# Patient Record
Sex: Female | Born: 1990 | Race: Black or African American | Hispanic: No | Marital: Single | Smoking: Former smoker
Health system: Southern US, Community
[De-identification: ages and names within clinical notes are randomized; demographics above are authoritative.]

## PROBLEM LIST (undated history)

## (undated) DIAGNOSIS — I10 Essential (primary) hypertension: Secondary | ICD-10-CM

## (undated) HISTORY — PX: CERVICAL CONE BIOPSY: SUR198

## (undated) HISTORY — PX: LEEP: SHX91

---

## 2013-10-28 ENCOUNTER — Emergency Department (HOSPITAL_BASED_OUTPATIENT_CLINIC_OR_DEPARTMENT_OTHER)
Admission: EM | Admit: 2013-10-28 | Discharge: 2013-10-28 | Disposition: A | Discharge status: Home / Self Care | Payer: Managed Care, Other (non HMO) | Attending: Emergency Medicine | Admitting: Emergency Medicine

## 2013-10-28 ENCOUNTER — Encounter (HOSPITAL_BASED_OUTPATIENT_CLINIC_OR_DEPARTMENT_OTHER): Payer: Self-pay | Admitting: Emergency Medicine

## 2013-10-28 ENCOUNTER — Emergency Department (HOSPITAL_BASED_OUTPATIENT_CLINIC_OR_DEPARTMENT_OTHER): Payer: Managed Care, Other (non HMO)

## 2013-10-28 DIAGNOSIS — M722 Plantar fascial fibromatosis: Secondary | ICD-10-CM | POA: Insufficient documentation

## 2013-10-28 MED ORDER — IBUPROFEN 800 MG PO TABS: 800.0000 mg | ORAL_TABLET | Freq: Three times a day (TID) | ORAL | Status: DC

## 2013-10-28 NOTE — Discharge Instructions (Signed)

## 2013-10-28 NOTE — ED Provider Notes (Signed)
Medical screening examination/treatment/procedure(s) were performed by non-physician practitioner and as supervising physician I was immediately available for consultation/collaboration.  EKG Interpretation   None         Dagmar HaitWilliam Shallen Luedke, MD 10/28/13 Serena [id number]

## 2013-10-28 NOTE — ED Notes (Signed)
Patient transported to X-ray ambulatory with tech. 

## 2013-10-28 NOTE — ED Provider Notes (Signed)
CSN: [id number]     Arrival date & time 10/28/13  1703 History   First MD Initiated Contact with Patient 10/28/13 1727     Chief Complaint  Patient presents with  . Foot Pain   (Consider location/radiation/quality/duration/timing/severity/associated sxs/prior Treatment) Patient is a 23 y.o. female presenting with foot injury. No language interpreter was used.  Foot Injury Location:  Foot Foot location:  L foot Pain details:    Quality:  Aching   Severity:  Moderate   Onset quality:  Gradual   Duration:  2 days   Timing:  Constant   Progression:  Worsening Foreign body present:  No foreign bodies Prior injury to area:  No Worsened by:  Nothing tried Associated symptoms: no back pain     History reviewed. No pertinent past medical history. History reviewed. No pertinent past surgical history. History reviewed. No pertinent family history. History  Substance Use Topics  . Smoking status: Never Smoker   . Smokeless tobacco: Not on file  . Alcohol Use: No   OB History   Grav Para Term Preterm Abortions TAB SAB Ect Mult Living                 Review of Systems  Musculoskeletal: Negative for back pain.  All other systems reviewed and are negative.    Allergies  Review of patient's allergies indicates no known allergies.  Home Medications  No current outpatient prescriptions on file. BP 130/78  Pulse 84  Temp(Src) 98.1 F (36.7 C) (Oral)  Resp 18  Ht 6' (1.829 m)  Wt 236 lb (107.049 kg)  BMI 32.00 kg/m2  SpO2 100%  LMP 08/23/2013 Physical Exam  Vitals reviewed. Constitutional: She is oriented to person, place, and time. She appears well-developed and well-nourished.  HENT:  Head: Normocephalic.  Musculoskeletal: She exhibits tenderness.  Swollen left foot,  From  nv and ns intact    Neurological: She is alert and oriented to person, place, and time. She has normal reflexes.  Skin: Skin is warm.  Psychiatric: She has a normal mood and affect.    ED  Course  Procedures (including critical care time) Labs Review Labs Reviewed - No data to display Imaging Review No results found.  EKG Interpretation   None       MDM   1. Plantar fasciitis     Probable plantar fascitis.   Pt advised ibuprofen,   Exercises,   Buy shoe inserts,   Follow up with Dr. Pearletha ForgeHudnall for evaluation    Elson AreasLeslie K Sofia, PA-C 10/28/13 1909

## 2013-10-28 NOTE — ED Notes (Signed)
Pt c/o left foot pain with swelling x 1 month

## 2013-12-29 ENCOUNTER — Emergency Department (HOSPITAL_BASED_OUTPATIENT_CLINIC_OR_DEPARTMENT_OTHER)
Admission: EM | Admit: 2013-12-29 | Discharge: 2013-12-29 | Disposition: A | Discharge status: Home / Self Care | Payer: Managed Care, Other (non HMO) | Attending: Emergency Medicine | Admitting: Emergency Medicine

## 2013-12-29 ENCOUNTER — Encounter (HOSPITAL_BASED_OUTPATIENT_CLINIC_OR_DEPARTMENT_OTHER): Payer: Self-pay | Admitting: Emergency Medicine

## 2013-12-29 DIAGNOSIS — R51 Headache: Secondary | ICD-10-CM | POA: Insufficient documentation

## 2013-12-29 DIAGNOSIS — Z3202 Encounter for pregnancy test, result negative: Secondary | ICD-10-CM | POA: Insufficient documentation

## 2013-12-29 DIAGNOSIS — Z791 Long term (current) use of non-steroidal anti-inflammatories (NSAID): Secondary | ICD-10-CM | POA: Insufficient documentation

## 2013-12-29 DIAGNOSIS — F17200 Nicotine dependence, unspecified, uncomplicated: Secondary | ICD-10-CM | POA: Insufficient documentation

## 2013-12-29 DIAGNOSIS — R42 Dizziness and giddiness: Secondary | ICD-10-CM | POA: Insufficient documentation

## 2013-12-29 LAB — CBC WITH DIFFERENTIAL/PLATELET
Basophils Absolute: 0.1 10*3/uL (ref 0.0–0.1)
Basophils Relative: 1 % (ref 0–1)
EOS ABS: 0.2 10*3/uL (ref 0.0–0.7)
Eosinophils Relative: 2 % (ref 0–5)
HEMATOCRIT: 42.2 % (ref 36.0–46.0)
HEMOGLOBIN: 14 g/dL (ref 12.0–15.0)
LYMPHS ABS: 3.9 10*3/uL (ref 0.7–4.0)
Lymphocytes Relative: 42 % (ref 12–46)
MCH: 30.2 pg (ref 26.0–34.0)
MCHC: 33.2 g/dL (ref 30.0–36.0)
MCV: 91.1 fL (ref 78.0–100.0)
MONO ABS: 1.1 10*3/uL — AB (ref 0.1–1.0)
MONOS PCT: 12 % (ref 3–12)
Neutro Abs: 4.2 10*3/uL (ref 1.7–7.7)
Neutrophils Relative %: 44 % (ref 43–77)
Platelets: 328 10*3/uL (ref 150–400)
RBC: 4.63 MIL/uL (ref 3.87–5.11)
RDW: 13 % (ref 11.5–15.5)
WBC: 9.5 10*3/uL (ref 4.0–10.5)

## 2013-12-29 LAB — URINALYSIS, ROUTINE W REFLEX MICROSCOPIC
BILIRUBIN URINE: NEGATIVE
GLUCOSE, UA: NEGATIVE mg/dL
Hgb urine dipstick: NEGATIVE
KETONES UR: NEGATIVE mg/dL
Nitrite: NEGATIVE
PROTEIN: NEGATIVE mg/dL
Specific Gravity, Urine: 1.023 (ref 1.005–1.030)
Urobilinogen, UA: 0.2 mg/dL (ref 0.0–1.0)
pH: 5.5 (ref 5.0–8.0)

## 2013-12-29 LAB — URINE MICROSCOPIC-ADD ON

## 2013-12-29 LAB — BASIC METABOLIC PANEL
BUN: 14 mg/dL (ref 6–23)
CO2: 27 mEq/L (ref 19–32)
Calcium: 9.4 mg/dL (ref 8.4–10.5)
Chloride: 100 mEq/L (ref 96–112)
Creatinine, Ser: 0.9 mg/dL (ref 0.50–1.10)
GFR calc Af Amer: >90 mL/min (ref >90)
GFR calc non Af Amer: 89 mL/min — ABNORMAL LOW (ref >90)
GLUCOSE: 99 mg/dL (ref 70–99)
Potassium: 4 mEq/L (ref 3.7–5.3)
Sodium: 140 mEq/L (ref 137–147)

## 2013-12-29 LAB — PREGNANCY, URINE: Preg Test, Ur: NEGATIVE

## 2013-12-29 NOTE — ED Notes (Signed)
Pt states she was at work and had a sudden onset of feeling dizzy and hot.

## 2013-12-29 NOTE — ED Notes (Signed)
Pt reports was at work when suddenly dizzy and had ha.

## 2013-12-29 NOTE — ED Notes (Signed)
Patient ambulates around RN station without assistance or difficulty.

## 2013-12-29 NOTE — ED Provider Notes (Signed)
Medical screening examination/treatment/procedure(s) were performed by non-physician practitioner and as supervising physician I was immediately available for consultation/collaboration.   EKG Interpretation None        Jessica B. Bernette MayersSheldon, MD 12/29/13 [id number]

## 2013-12-29 NOTE — Discharge Instructions (Signed)

## 2013-12-29 NOTE — ED Provider Notes (Signed)
CSN: [id number]     Arrival date & time 12/29/13  1705 History   First MD Initiated Contact with Patient 12/29/13 1711     Chief Complaint  Patient presents with  . Dizziness     (Consider location/radiation/quality/duration/timing/severity/associated sxs/prior Treatment) HPI Comments: Pt state that she got really hot and then felt near syncopal and now has a headache. Pt states that besides feeling warm now the symptoms are getting better. Pt states that she took ibuprofen a shot time ago. lmp was November. No visual changes, cp, sob, n/v/d. Pt last ate about 3 hours ago  Patient is a 23 y.o. female presenting with dizziness. The history is provided by the patient. No language interpreter was used.  Dizziness Quality:  Lightheadedness Severity:  Moderate Onset quality:  Sudden Timing:  Constant Progression:  Unchanged Chronicity:  New Relieved by:  Nothing Associated symptoms: no blood in stool, no chest pain, no nausea, no palpitations and no vision changes     History reviewed. No pertinent past medical history. History reviewed. No pertinent past surgical history. No family history on file. History  Substance Use Topics  . Smoking status: Current Every Day Smoker    Types: Cigars  . Smokeless tobacco: Not on file  . Alcohol Use: No   OB History   Grav Para Term Preterm Abortions TAB SAB Ect Mult Living                 Review of Systems  Constitutional: Negative.   Cardiovascular: Negative for chest pain and palpitations.  Gastrointestinal: Negative for nausea and blood in stool.  Neurological: Positive for dizziness.      Allergies  Review of patient's allergies indicates no known allergies.  Home Medications   Current Outpatient Rx  Name  Route  Sig  Dispense  Refill  . ibuprofen (ADVIL,MOTRIN) 800 MG tablet   Oral   Take 1 tablet (800 mg total) by mouth 3 (three) times daily.   21 tablet   0    BP 128/87  Pulse 93  Temp(Src) 98.5 F (36.9 C)  (Oral)  Resp 18  Ht 6' (1.829 m)  Wt 235 lb (106.595 kg)  BMI 31.86 kg/m2  SpO2 100% Physical Exam  Nursing note and vitals reviewed. Constitutional: She is oriented to person, place, and time. She appears well-developed and well-nourished.  HENT:  Head: Normocephalic and atraumatic.  Eyes: EOM are normal.  Cardiovascular: Normal rate and regular rhythm.   Pulmonary/Chest: Effort normal and breath sounds normal.  Abdominal: Soft. Bowel sounds are normal. There is no tenderness.  Musculoskeletal: Normal range of motion.  Neurological: She is alert and oriented to person, place, and time. Coordination normal.  Skin: Skin is warm and dry.  Psychiatric: She has a normal mood and affect.    ED Course  Procedures (including critical care time) Labs Review Labs Reviewed  URINALYSIS, ROUTINE W REFLEX MICROSCOPIC - Abnormal; Notable for the following:    APPearance CLOUDY (*)    Leukocytes, UA SMALL (*)    All other components within normal limits  CBC WITH DIFFERENTIAL - Abnormal; Notable for the following:    Monocytes Absolute 1.1 (*)    All other components within normal limits  BASIC METABOLIC PANEL - Abnormal; Notable for the following:    GFR calc non Af Amer 89 (*)    All other components within normal limits  URINE MICROSCOPIC-ADD ON - Abnormal; Notable for the following:    Squamous Epithelial / LPF FEW (*)  Bacteria, UA MANY (*)    All other components within normal limits  URINE CULTURE  PREGNANCY, URINE   Imaging Review No results found.   EKG Interpretation None      MDM   Final diagnoses:  Dizziness    Pt ambulating without any problem and is feeling better. Pt okay to go home. No acute findings noted    Teressa Lower, NP 12/29/13 1827

## 2014-01-01 LAB — URINE CULTURE

## 2014-12-19 ENCOUNTER — Emergency Department (HOSPITAL_BASED_OUTPATIENT_CLINIC_OR_DEPARTMENT_OTHER): Payer: Managed Care, Other (non HMO)

## 2014-12-19 ENCOUNTER — Encounter (HOSPITAL_BASED_OUTPATIENT_CLINIC_OR_DEPARTMENT_OTHER): Payer: Self-pay | Admitting: *Deleted

## 2014-12-19 ENCOUNTER — Emergency Department (HOSPITAL_BASED_OUTPATIENT_CLINIC_OR_DEPARTMENT_OTHER)
Admission: EM | Admit: 2014-12-19 | Discharge: 2014-12-19 | Disposition: A | Discharge status: Home / Self Care | Payer: Managed Care, Other (non HMO) | Attending: Emergency Medicine | Admitting: Emergency Medicine

## 2014-12-19 DIAGNOSIS — R51 Headache: Secondary | ICD-10-CM | POA: Diagnosis present

## 2014-12-19 DIAGNOSIS — R6884 Jaw pain: Secondary | ICD-10-CM | POA: Diagnosis not present

## 2014-12-19 DIAGNOSIS — Z720 Tobacco use: Secondary | ICD-10-CM | POA: Insufficient documentation

## 2014-12-19 DIAGNOSIS — Z791 Long term (current) use of non-steroidal anti-inflammatories (NSAID): Secondary | ICD-10-CM | POA: Diagnosis not present

## 2014-12-19 MED ORDER — HYDROCODONE-ACETAMINOPHEN 5-325 MG PO TABS
1.0000 | ORAL_TABLET | Freq: Once | ORAL | Status: AC
  Administered 2014-12-19: 1 via ORAL
  Filled 2014-12-19: qty 1

## 2014-12-19 MED ORDER — TRAMADOL HCL 50 MG PO TABS: 50.0000 mg | ORAL_TABLET | Freq: Four times a day (QID) | ORAL | Status: DC | PRN

## 2014-12-19 MED ORDER — NAPROXEN 500 MG PO TABS: 500.0000 mg | ORAL_TABLET | Freq: Two times a day (BID) | ORAL | Status: DC

## 2014-12-19 NOTE — Discharge Instructions (Signed)
Takes naproxen and tramadol for pain. Try warm compresses. Avoid any hard foods. Follow-up with her doctor or return here if the symptoms are worsening.  Temporomandibular Problems  Temporomandibular joint (TMJ) dysfunction means there are problems with the joint between your jaw and your skull. This is a joint lined by cartilage like other joints in your body but also has a small disc in the joint which keeps the bones from rubbing on each other. These joints are like other joints and can get inflamed (sore) from arthritis and other problems. When this joint gets sore, it can cause headaches and pain in the jaw and the face. CAUSES  Usually the arthritic types of problems are caused by soreness in the joint. Soreness in the joint can also be caused by overuse. This may come from grinding your teeth. It may also come from mis-alignment in the joint. DIAGNOSIS Diagnosis of this condition can often be made by history and exam. Sometimes your caregiver may need X-rays or an MRI scan to determine the exact cause. It may be necessary to see your dentist to determine if your teeth and jaws are lined up correctly. TREATMENT  Most of the time this problem is not serious; however, sometimes it can persist (become chronic). When this happens medications that will cut down on inflammation (soreness) help. Sometimes a shot of cortisone into the joint will be helpful. If your teeth are not aligned it may help for your dentist to make a splint for your mouth that can help this problem. If no physical problems can be found, the problem may come from tension. If tension is found to be the cause, biofeedback or relaxation techniques may be helpful. HOME CARE INSTRUCTIONS   Later in the day, applications of ice packs may be helpful. Ice can be used in a plastic bag with a towel around it to prevent frostbite to skin. This may be used about every 2 hours for 20 to 30 minutes, as needed while awake, or as directed by your  caregiver.  Only take over-the-counter or prescription medicines for pain, discomfort, or fever as directed by your caregiver.  If physical therapy was prescribed, follow your caregiver's directions.  Wear mouth appliances as directed if they were given. Document Released: 06/19/2001 Document Revised: 12/17/2011 Document Reviewed: 09/26/2008 [facility] Patient Information 2015 [employer] [place], [place]. This information is not intended to replace advice given to you by your health care provider. Make sure you discuss any questions you have with your health care provider.

## 2014-12-19 NOTE — ED Provider Notes (Signed)
CSN: [id number]     Arrival date & time 12/19/14  1446 History   First MD Initiated Contact with Patient 12/19/14 1550     Chief Complaint  Patient presents with  . Facial Pain     (Consider location/radiation/quality/duration/timing/severity/associated sxs/prior Treatment) HPI Jessica Murray is a 24 y.o. female with no medical problems, presents to emergency department complaining of left-sided jaw pain. Patient states that pain started when she woke up this morning. Patient states that she is having pain opening and closing her mouth. She states she is unable to eat or by downtrending. She denies a toothache. She denies any dental injuries. She denies any jaw injuries. She denies any fever or chills. No pain with swallowing. States pain is around her left ear. No drainage from the ear. No upper respiratory symptoms. No history of the same.  History reviewed. No pertinent past medical history. History reviewed. No pertinent past surgical history. No family history on file. History  Substance Use Topics  . Smoking status: Current Every Day Smoker    Types: Cigars  . Smokeless tobacco: Never Used  . Alcohol Use: Yes     Comment: weekends   OB History    No data available     Review of Systems  HENT: Positive for facial swelling. Negative for congestion, dental problem and sore throat.   Neurological: Positive for headaches.  All other systems reviewed and are negative.     Allergies  Review of patient's allergies indicates no known allergies.  Home Medications   Prior to Admission medications   Medication Sig Start Date End Date Taking? Authorizing Provider  ibuprofen (ADVIL,MOTRIN) 800 MG tablet Take 1 tablet (800 mg total) by mouth 3 (three) times daily. 10/28/13   Lonia Skinner Sofia, PA-C   BP 130/90 mmHg  Pulse 106  Temp(Src) 98.9 F (37.2 C) (Oral)  Resp 18  Ht 6' (1.829 m)  Wt 256 lb (116.121 kg)  BMI 34.71 kg/m2  SpO2 100% Physical Exam  Constitutional: She  appears well-developed and well-nourished. No distress.  HENT:  No obvious facial swelling noted.  No swelling under the tongue. No trismus. Tender to palpation over her left TMJ, tender to palpation around the left mandible and just inferior to left ear. No tenderness over her mastoid. TMs are normal bilaterally. Dentition unremarkable  Eyes: Conjunctivae are normal.  Neck: Neck supple.  Neurological: She is alert.  Skin: Skin is warm and dry.  Nursing note and vitals reviewed.   ED Course  Procedures (including critical care time) Labs Review Labs Reviewed - No data to display  Imaging Review Dg Mandible 4 Views  12/19/2014   CLINICAL DATA:  Left-sided jaw pain for 1 day  EXAM: MANDIBLE - 4+ VIEW  COMPARISON:  None.  FINDINGS: There is no evidence of fracture or other focal bone lesions.  IMPRESSION: Negative.   Electronically Signed   By: Elige Ko   On: 12/19/2014 17:05     EKG Interpretation None      MDM   Final diagnoses:  Jaw pain    patient with left facial/TM J pain, no signs of infection on exam. Dentition is normal. Mandibular films obtained and are negative. She is afebrile, nontoxic appearing. Patient admits to drinking last night of partying, denies any injuries however. 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 I did instruct her to return if she develops any swelling to the area, fever, chills, new concerning symptoms.  Filed Vitals:  12/19/14 1454  BP: 130/90  Pulse: 106  Temp: 98.9 F (37.2 C)  TempSrc: Oral  Resp: 18  Height: 6' (1.829 m)  Weight: 256 lb (116.121 kg)  SpO2: 100%       Jaynie Crumbleatyana Judas Mohammad, PA-C 12/19/14 1722  Jerelyn ScottMartha Linker, MD 12/19/14 1727

## 2014-12-19 NOTE — ED Notes (Signed)
Left side jaw pain since waking- denies injury

## 2015-01-01 ENCOUNTER — Encounter (HOSPITAL_BASED_OUTPATIENT_CLINIC_OR_DEPARTMENT_OTHER): Payer: Self-pay

## 2015-01-01 ENCOUNTER — Emergency Department (HOSPITAL_BASED_OUTPATIENT_CLINIC_OR_DEPARTMENT_OTHER)
Admission: EM | Admit: 2015-01-01 | Discharge: 2015-01-01 | Disposition: A | Discharge status: Home / Self Care | Payer: Managed Care, Other (non HMO) | Attending: Emergency Medicine | Admitting: Emergency Medicine

## 2015-01-01 ENCOUNTER — Emergency Department (HOSPITAL_BASED_OUTPATIENT_CLINIC_OR_DEPARTMENT_OTHER): Payer: Managed Care, Other (non HMO)

## 2015-01-01 DIAGNOSIS — R69 Illness, unspecified: Secondary | ICD-10-CM

## 2015-01-01 DIAGNOSIS — Z720 Tobacco use: Secondary | ICD-10-CM | POA: Insufficient documentation

## 2015-01-01 DIAGNOSIS — Z3202 Encounter for pregnancy test, result negative: Secondary | ICD-10-CM | POA: Insufficient documentation

## 2015-01-01 DIAGNOSIS — J111 Influenza due to unidentified influenza virus with other respiratory manifestations: Secondary | ICD-10-CM | POA: Insufficient documentation

## 2015-01-01 DIAGNOSIS — R000 Tachycardia, unspecified: Secondary | ICD-10-CM | POA: Diagnosis not present

## 2015-01-01 DIAGNOSIS — Z791 Long term (current) use of non-steroidal anti-inflammatories (NSAID): Secondary | ICD-10-CM | POA: Insufficient documentation

## 2015-01-01 DIAGNOSIS — R630 Anorexia: Secondary | ICD-10-CM | POA: Insufficient documentation

## 2015-01-01 DIAGNOSIS — R05 Cough: Secondary | ICD-10-CM | POA: Diagnosis present

## 2015-01-01 LAB — CBC WITH DIFFERENTIAL/PLATELET
Basophils Absolute: 0 10*3/uL (ref 0.0–0.1)
Basophils Relative: 0 % (ref 0–1)
Eosinophils Absolute: 0 10*3/uL (ref 0.0–0.7)
Eosinophils Relative: 0 % (ref 0–5)
HCT: 44.1 % (ref 36.0–46.0)
Hemoglobin: 14.6 g/dL (ref 12.0–15.0)
Lymphocytes Relative: 28 % (ref 12–46)
Lymphs Abs: 1.8 10*3/uL (ref 0.7–4.0)
MCH: 29.5 pg (ref 26.0–34.0)
MCHC: 33.1 g/dL (ref 30.0–36.0)
MCV: 89.1 fL (ref 78.0–100.0)
MONO ABS: 1.5 10*3/uL — AB (ref 0.1–1.0)
Monocytes Relative: 23 % — ABNORMAL HIGH (ref 3–12)
Neutro Abs: 3.2 10*3/uL (ref 1.7–7.7)
Neutrophils Relative %: 49 % (ref 43–77)
Platelets: 329 10*3/uL (ref 150–400)
RBC: 4.95 MIL/uL (ref 3.87–5.11)
RDW: 13.3 % (ref 11.5–15.5)
WBC: 6.6 10*3/uL (ref 4.0–10.5)

## 2015-01-01 LAB — COMPREHENSIVE METABOLIC PANEL
ALBUMIN: 3.9 g/dL (ref 3.5–5.2)
ALK PHOS: 99 U/L (ref 39–117)
ALT: 23 U/L (ref 0–35)
ANION GAP: 9 (ref 5–15)
AST: 19 U/L (ref 0–37)
BUN: 9 mg/dL (ref 6–23)
CHLORIDE: 104 mmol/L (ref 96–112)
CO2: 23 mmol/L (ref 19–32)
Calcium: 9 mg/dL (ref 8.4–10.5)
Creatinine, Ser: 0.88 mg/dL (ref 0.50–1.10)
GFR calc Af Amer: >90 mL/min (ref >90)
GFR calc non Af Amer: >90 mL/min (ref >90)
GLUCOSE: 121 mg/dL — AB (ref 70–99)
Potassium: 3.5 mmol/L (ref 3.5–5.1)
Sodium: 136 mmol/L (ref 135–145)
TOTAL PROTEIN: 7.9 g/dL (ref 6.0–8.3)

## 2015-01-01 LAB — PREGNANCY, URINE: Preg Test, Ur: NEGATIVE

## 2015-01-01 LAB — URINE MICROSCOPIC-ADD ON

## 2015-01-01 LAB — URINALYSIS, ROUTINE W REFLEX MICROSCOPIC
BILIRUBIN URINE: NEGATIVE
GLUCOSE, UA: NEGATIVE mg/dL
KETONES UR: NEGATIVE mg/dL
Leukocytes, UA: NEGATIVE
Nitrite: NEGATIVE
Protein, ur: NEGATIVE mg/dL
Specific Gravity, Urine: 1.017 (ref 1.005–1.030)
UROBILINOGEN UA: 1 mg/dL (ref 0.0–1.0)
pH: 5.5 (ref 5.0–8.0)

## 2015-01-01 MED ORDER — SODIUM CHLORIDE 0.9 % IV BOLUS (SEPSIS)
1000.0000 mL | Freq: Once | INTRAVENOUS | Status: AC
  Administered 2015-01-01: 1000 mL via INTRAVENOUS

## 2015-01-01 MED ORDER — ONDANSETRON HCL 4 MG/2ML IJ SOLN
4.0000 mg | Freq: Once | INTRAMUSCULAR | Status: AC
  Administered 2015-01-01: 4 mg via INTRAVENOUS
  Filled 2015-01-01: qty 2

## 2015-01-01 MED ORDER — KETOROLAC TROMETHAMINE 30 MG/ML IJ SOLN
30.0000 mg | Freq: Once | INTRAMUSCULAR | Status: AC
  Administered 2015-01-01: 30 mg via INTRAVENOUS
  Filled 2015-01-01: qty 1

## 2015-01-01 MED ORDER — ACETAMINOPHEN 325 MG PO TABS
650.0000 mg | ORAL_TABLET | Freq: Once | ORAL | Status: AC
  Administered 2015-01-01: 650 mg via ORAL
  Filled 2015-01-01: qty 2

## 2015-01-01 NOTE — Discharge Instructions (Signed)

## 2015-01-01 NOTE — ED Notes (Signed)
Pt reports 2 days of body aches, subjective fever, n/v and cough/congestion.  Denies sore throat.  No known sick contacts.

## 2015-01-01 NOTE — ED Provider Notes (Signed)
CSN: [id number]     Arrival date & time 01/01/15  1527 History  This chart was scribed for Tilden Fossa, MD by [place] Peace, ED Scribe. The patient was seen in [facility]. The patient's care was started at 3:56 PM.    Chief Complaint  Patient presents with  . Influenza      Patient is a 24 y.o. female presenting with flu symptoms. The history is provided by the patient. No language interpreter was used.  Influenza Presenting symptoms: cough, headache, myalgias, nausea and vomiting   Presenting symptoms: no fever and no sore throat   Associated symptoms: chills and nasal congestion   HPI Comments: Jessica Murray is a 24 y.o. female who presents to the Emergency Department complaining of flu-like symptoms onset a couple days ago including chills, body aches, headache, loss of appetite, nausea, and vomiting. She also complains of cough and congestion. No complaints of sore throat, fever, dysuria, or rash. She denies any exposure to sick contacts. Pt is a current everyday smoker.  She vomited once yesterday.   History reviewed. No pertinent past medical history. History reviewed. No pertinent past surgical history. No family history on file. History  Substance Use Topics  . Smoking status: Current Every Day Smoker    Types: Cigars  . Smokeless tobacco: Never Used  . Alcohol Use: No   OB History    No data available     Review of Systems  Constitutional: Positive for chills and appetite change. Negative for fever.  HENT: Positive for congestion. Negative for sore throat.   Respiratory: Positive for cough.   Gastrointestinal: Positive for nausea and vomiting.  Genitourinary: Negative for dysuria.  Musculoskeletal: Positive for myalgias.  Skin: Negative for rash.  Neurological: Positive for headaches.  All other systems reviewed and are negative.     Allergies  Review of patient's allergies indicates no known allergies.  Home Medications   Prior to Admission medications    Medication Sig Start Date End Date Taking? Authorizing Provider  ibuprofen (ADVIL,MOTRIN) 800 MG tablet Take 1 tablet (800 mg total) by mouth 3 (three) times daily. 10/28/13   Elson Areas, PA-C  naproxen (NAPROSYN) 500 MG tablet Take 1 tablet (500 mg total) by mouth 2 (two) times daily. 12/19/14   Tatyana Kirichenko, PA-C  traMADol (ULTRAM) 50 MG tablet Take 1 tablet (50 mg total) by mouth every 6 (six) hours as needed. 12/19/14   Tatyana Kirichenko, PA-C   BP 126/80 mmHg  Pulse 132  Temp(Src) 99.6 F (37.6 C) (Oral)  Resp 19  Ht 6' (1.829 m)  Wt 235 lb (106.595 kg)  BMI 31.86 kg/m2  SpO2 99% Physical Exam  Constitutional: She is oriented to person, place, and time. She appears well-developed and well-nourished.  HENT:  Head: Normocephalic and atraumatic.  Right Ear: External ear normal.  Left Ear: External ear normal.  Mouth/Throat: Oropharynx is clear and moist.  Eyes: Pupils are equal, round, and reactive to light.  Neck: Neck supple.  Cardiovascular: Regular rhythm.  Tachycardia present.   No murmur heard. Pulmonary/Chest: Effort normal and breath sounds normal. No respiratory distress.  Abdominal: Soft. There is no tenderness. There is no rebound and no guarding.  Musculoskeletal: She exhibits no edema or tenderness.  Neurological: She is alert and oriented to person, place, and time.  Skin: Skin is warm and dry.  Psychiatric: She has a normal mood and affect. Her behavior is normal.  Nursing note and vitals reviewed.   ED Course  Procedures (  including critical care time) Labs Review Labs Reviewed - No data to display  Imaging Review No results found.   EKG Interpretation None     Medications - No data to display  4:00 PM- Treatment plan was discussed with patient who verbalizes understanding and agrees.   MDM   Final diagnoses:  Influenza-like illness    patient here for evaluation of chills, nausea, myalgias. Clinical picture is consistent with acute  viral illness, likely influenza. Patient is nontoxic appearing. There is no evidence of acute bacterial infection. Discussed with patient home care for influenza with oral fluid hydration, ibuprofen and Tylenol for fever control and body aches. Discussed return precautions.   I personally performed the services described in this documentation, which was scribed in my presence. The recorded information has been reviewed and is accurate.   Tilden FossaElizabeth Kynadee Dam, MD 01/01/15 [phone]

## 2015-03-24 ENCOUNTER — Emergency Department (HOSPITAL_BASED_OUTPATIENT_CLINIC_OR_DEPARTMENT_OTHER)
Admission: EM | Admit: 2015-03-24 | Discharge: 2015-03-24 | Disposition: A | Discharge status: Home / Self Care | Payer: Managed Care, Other (non HMO) | Attending: Emergency Medicine | Admitting: Emergency Medicine

## 2015-03-24 DIAGNOSIS — Z791 Long term (current) use of non-steroidal anti-inflammatories (NSAID): Secondary | ICD-10-CM | POA: Diagnosis not present

## 2015-03-24 DIAGNOSIS — R51 Headache: Secondary | ICD-10-CM | POA: Diagnosis not present

## 2015-03-24 DIAGNOSIS — R42 Dizziness and giddiness: Secondary | ICD-10-CM | POA: Diagnosis not present

## 2015-03-24 DIAGNOSIS — Z720 Tobacco use: Secondary | ICD-10-CM | POA: Insufficient documentation

## 2015-03-24 DIAGNOSIS — R519 Headache, unspecified: Secondary | ICD-10-CM

## 2015-03-24 MED ORDER — METOCLOPRAMIDE HCL 5 MG/ML IJ SOLN
10.0000 mg | Freq: Once | INTRAMUSCULAR | Status: AC
  Administered 2015-03-24: 10 mg via INTRAVENOUS
  Filled 2015-03-24: qty 2

## 2015-03-24 MED ORDER — KETOROLAC TROMETHAMINE 30 MG/ML IJ SOLN
30.0000 mg | Freq: Once | INTRAMUSCULAR | Status: AC
  Administered 2015-03-24: 30 mg via INTRAVENOUS
  Filled 2015-03-24: qty 1

## 2015-03-24 MED ORDER — SODIUM CHLORIDE 0.9 % IV BOLUS (SEPSIS)
1000.0000 mL | Freq: Once | INTRAVENOUS | Status: AC
  Administered 2015-03-24: 1000 mL via INTRAVENOUS

## 2015-03-24 MED ORDER — DIPHENHYDRAMINE HCL 50 MG/ML IJ SOLN
50.0000 mg | Freq: Once | INTRAMUSCULAR | Status: AC
  Administered 2015-03-24: 50 mg via INTRAVENOUS
  Filled 2015-03-24: qty 1

## 2015-03-24 NOTE — ED Notes (Signed)
Pt. Escorted to ED via EMS due to Pt. At work today at [employer] and she stepped out side and felt hot and stepped back in and felt shaky.  EMS was called and Pt. Reports a headache.  Pt. Reports some nausea with no vomiting and no diarrhea.  Pt. Has no s/s of trauma or stroke.

## 2015-03-24 NOTE — ED Notes (Signed)
Patient states she has a two or three day history of an off and on headache.  States today she was sitting outside on her break in the shade, when she went back into the building she became dizzy and lightheadedness and her headache was more painful.  Denies nausea or vomiting.

## 2015-03-24 NOTE — Discharge Instructions (Signed)

## 2015-03-24 NOTE — ED Notes (Signed)
Report received from Arline Asp, RN and care assumed.

## 2015-03-24 NOTE — ED Provider Notes (Signed)
CSN: [id number]     Arrival date & time 03/24/15  1622 History   First MD Initiated Contact with Patient 03/24/15 1623     Chief Complaint  Patient presents with  . Headache     (Consider location/radiation/quality/duration/timing/severity/associated sxs/prior Treatment) HPI [name] is a 24 y.o. female comes in for evaluation of headache. Patient states she has had a headache eminently for the past 3 days. The headache got worse today when she was at work, sitting outside initiated and walked into the sun. She reports going back into the building where she works feeling shaky and dizzy. She denies any numbness or weakness, nausea or vomiting, chest pain, shortness of breath. No fevers, chills, neck pain or stiffness. No interventions tried to improve symptoms. Patient reports she only had one bottle of water to drink today. No other aggravating or modifying factors.  No past medical history on file. No past surgical history on file. No family history on file. History  Substance Use Topics  . Smoking status: Current Every Day Smoker    Types: Cigars  . Smokeless tobacco: Never Used  . Alcohol Use: No   OB History    No data available     Review of Systems A 10 point review of systems was completed and was negative except for pertinent positives and negatives as mentioned in the history of present illness     Allergies  Review of patient's allergies indicates no known allergies.  Home Medications   Prior to Admission medications   Medication Sig Start Date End Date Taking? Authorizing Provider  ibuprofen (ADVIL,MOTRIN) 800 MG tablet Take 1 tablet (800 mg total) by mouth 3 (three) times daily. 10/28/13   Elson Areas, PA-C  naproxen (NAPROSYN) 500 MG tablet Take 1 tablet (500 mg total) by mouth 2 (two) times daily. 12/19/14   Tatyana Kirichenko, PA-C  traMADol (ULTRAM) 50 MG tablet Take 1 tablet (50 mg total) by mouth every 6 (six) hours as needed. 12/19/14   Tatyana  Kirichenko, PA-C   BP 112/74 mmHg  Pulse 93  Temp(Src) 98.9 F (37.2 C) (Oral)  Resp 18  Ht 6' (1.829 m)  Wt 256 lb (116.121 kg)  BMI 34.71 kg/m2  SpO2 97%  LMP 03/24/2015 Physical Exam  Constitutional: She is oriented to person, place, and time. She appears well-developed and well-nourished. No distress.  HENT:  Head: Normocephalic and atraumatic.  Mouth/Throat: Oropharynx is clear and moist.  Eyes: Conjunctivae are normal. Pupils are equal, round, and reactive to light. Right eye exhibits no discharge. Left eye exhibits no discharge. No scleral icterus.  Neck: Normal range of motion. Neck supple.  Cardiovascular: Normal rate, regular rhythm and normal heart sounds.   Pulmonary/Chest: Effort normal and breath sounds normal. No respiratory distress. She has no wheezes. She has no rales.  Abdominal: Soft. There is no tenderness.  Musculoskeletal: She exhibits no tenderness.  Neurological: She is alert and oriented to person, place, and time.  Cranial Nerves II-XII grossly intact. Motor and sensation 5/5 in all 4 extremities.  Skin: Skin is warm and dry. No rash noted. She is not diaphoretic.  Psychiatric: She has a normal mood and affect.  Nursing note and vitals reviewed.   ED Course  Procedures (including critical care time) Labs Review Labs Reviewed - No data to display  Imaging Review No results found.   EKG Interpretation None     Meds given in ED:  Medications  ketorolac (TORADOL) 30 MG/ML injection 30 mg (  30 mg Intravenous Given 03/24/15 1702)  metoCLOPramide (REGLAN) injection 10 mg (10 mg Intravenous Given 03/24/15 1700)  diphenhydrAMINE (BENADRYL) injection 50 mg (50 mg Intravenous Given 03/24/15 1701)  sodium chloride 0.9 % bolus 1,000 mL (0 mLs Intravenous Stopped 03/24/15 1819)    Discharge Medication List as of 03/24/2015  5:48 PM     Filed Vitals:   03/24/15 1625 03/24/15 1725 03/24/15 1821  BP: 136/82 122/76 112/74  Pulse: 107 87 93  Temp: 98.9 F  (37.2 C)    TempSrc: Oral    Resp: Height: 6' (1.829 m)    Weight: 256 lb (116.121 kg)    SpO2: 100% 97% 97%    MDM  Vitals stable - WNL -afebrile Pt resting comfortably in ED.  headache has resolved with migraine cocktail. PE--normal neuro exam and otherwise grossly benign physical exam.  DDX--patient with headache similar to previous she has had, gradual onset. Resolved with migraine cocktail. Low concern for [facility], CVA, other ICH. No evidence of other acute or emergent pathology at this time.  I discussed all relevant lab findings and imaging results with pt and they verbalized understanding. Discussed f/u with PCP within 48 hrs and return precautions, pt very amenable to plan. Patient stable, in good condition and is appropriate for discharge. Ambulates out of ED without difficulty.  Final diagnoses:  Nonintractable headache, unspecified chronicity pattern, unspecified headache type        Joycie Peek, PA-C 03/25/15 1610  Margarita Grizzle, MD 03/27/15 [phone]

## 2016-05-02 ENCOUNTER — Emergency Department (HOSPITAL_BASED_OUTPATIENT_CLINIC_OR_DEPARTMENT_OTHER)
Admission: EM | Admit: 2016-05-02 | Discharge: 2016-05-02 | Disposition: A | Discharge status: Home / Self Care | Payer: Managed Care, Other (non HMO) | Attending: Emergency Medicine | Admitting: Emergency Medicine

## 2016-05-02 ENCOUNTER — Encounter (HOSPITAL_BASED_OUTPATIENT_CLINIC_OR_DEPARTMENT_OTHER): Payer: Self-pay

## 2016-05-02 DIAGNOSIS — F17200 Nicotine dependence, unspecified, uncomplicated: Secondary | ICD-10-CM | POA: Insufficient documentation

## 2016-05-02 DIAGNOSIS — L259 Unspecified contact dermatitis, unspecified cause: Secondary | ICD-10-CM | POA: Insufficient documentation

## 2016-05-02 DIAGNOSIS — R21 Rash and other nonspecific skin eruption: Secondary | ICD-10-CM | POA: Diagnosis present

## 2016-05-02 MED ORDER — DEXAMETHASONE SODIUM PHOSPHATE 10 MG/ML IJ SOLN
10.0000 mg | Freq: Once | INTRAMUSCULAR | Status: AC
  Administered 2016-05-02: 10 mg via INTRAMUSCULAR
  Filled 2016-05-02: qty 1

## 2016-05-02 MED ORDER — HYDROXYZINE HCL 25 MG PO TABS
25.0000 mg | ORAL_TABLET | Freq: Once | ORAL | Status: AC
  Administered 2016-05-02: 25 mg via ORAL
  Filled 2016-05-02: qty 1

## 2016-05-02 MED ORDER — HYDROXYZINE HCL 25 MG PO TABS: 25.0000 mg | ORAL_TABLET | Freq: Four times a day (QID) | ORAL | 0 refills | Status: DC

## 2016-05-02 MED ORDER — PREDNISONE 10 MG (21) PO TBPK: 10.0000 mg | ORAL_TABLET | Freq: Every day | ORAL | 0 refills | Status: DC

## 2016-05-02 MED ORDER — FAMOTIDINE 20 MG PO TABS
20.0000 mg | ORAL_TABLET | Freq: Once | ORAL | Status: AC
  Administered 2016-05-02: 20 mg via ORAL
  Filled 2016-05-02: qty 1

## 2016-05-02 MED FILL — hydrOXYzine HCL 25 MG TABS: 25 | 3 days supply | Qty: 12 | Fill #0

## 2016-05-02 MED FILL — predniSONE 10 MG TABS: 10 | 12 days supply | Qty: 42 | Fill #0

## 2016-05-02 NOTE — ED Provider Notes (Signed)
[facility]   CSN: [id number] Arrival date & time: 05/02/16  1127  First Provider Contact:  First MD Initiated Contact with Patient 05/02/16 1140        History   Chief Complaint Chief Complaint  Patient presents with  . Rash    HPI Jessica Murray is a 25 y.o. female.  Pt said that she noticed a rash behind her left knee yesterday.  She said that she was outside and may have brushed up against something.  The pt said that she took a benadryl last night, but she is still itching.   The history is provided by the patient.  Rash   This is a new problem. The current episode started yesterday. The problem has not changed since onset.The problem is associated with plant contact. There has been no fever. The rash is present on the left lower leg. The patient is experiencing no pain. Associated symptoms include itching. She has tried antihistamines for the symptoms. The treatment provided mild relief.    History reviewed. No pertinent past medical history.  There are no active problems to display for this patient.   History reviewed. No pertinent surgical history.  OB History    No data available       Home Medications    Prior to Admission medications   Medication Sig Start Date End Date Taking? Authorizing Provider  hydrOXYzine (ATARAX/VISTARIL) 25 MG tablet Take 1 tablet (25 mg total) by mouth every 6 (six) hours. 05/02/16   Jacalyn Lefevre, MD  predniSONE (STERAPRED UNI-PAK 21 TAB) 10 MG (21) TBPK tablet Take 1 tablet (10 mg total) by mouth daily. Take 6 tabs by mouth daily  for 2 days, then 5 tabs for 2 days, then 4 tabs for 2 days, then 3 tabs for 2 days, 2 tabs for 2 days, then 1 tab by mouth daily for 2 days 05/02/16   Jacalyn Lefevre, MD    Family History No family history on file.  Social History Social History  Substance Use Topics  . Smoking status: Current Every Day Smoker  . Smokeless tobacco: Never Used  . Alcohol use No      Allergies   Review of patient's allergies indicates no known allergies.   Review of Systems Review of Systems  Skin: Positive for itching and rash.  All other systems reviewed and are negative.    Physical Exam Updated Vital Signs BP 143/92 (BP Location: Left Arm)   Pulse 111   Temp 98.1 F (36.7 C) (Oral)   Resp 18   Ht  (1.753 m)   Wt 256 lb (116.1 kg)   LMP 11/15/2015   SpO2 99%   BMI 37.80 kg/m   Physical Exam  Constitutional: She is oriented to person, place, and time. She appears well-developed and well-nourished.  HENT:  Head: Normocephalic and atraumatic.  Right Ear: External ear normal.  Left Ear: External ear normal.  Nose: Nose normal.  Mouth/Throat: Oropharynx is clear and moist.  Eyes: Conjunctivae and EOM are normal. Pupils are equal, round, and reactive to light.  Neck: Normal range of motion. Neck supple.  Cardiovascular: Normal rate, regular rhythm, normal heart sounds and intact distal pulses.   Pulmonary/Chest: Effort normal and breath sounds normal.  Abdominal: Soft. Bowel sounds are normal.  Musculoskeletal: Normal range of motion.  Neurological: She is alert and oriented to person, place, and time.  Skin:     Psychiatric: She has a normal mood and affect. Her  behavior is normal. Judgment and thought content normal.  Nursing note and vitals reviewed.    ED Treatments / Results  Labs (all labs ordered are listed, but only abnormal results are displayed) Labs Reviewed - No data to display  EKG  EKG Interpretation None       Radiology No results found.  Procedures Procedures (including critical care time)  Medications Ordered in ED Medications  dexamethasone (DECADRON) injection 10 mg (10 mg Intramuscular Given 05/02/16 1152)  hydrOXYzine (ATARAX/VISTARIL) tablet 25 mg (25 mg Oral Given 05/02/16 1149)  famotidine (PEPCID) tablet 20 mg (20 mg Oral Given 05/02/16 1151)     Initial Impression / Assessment and Plan / ED  Course  I have reviewed the triage vital signs and the nursing notes.  Pertinent labs & imaging results that were available during my care of the patient were reviewed by me and considered in my medical decision making (see chart for details).  Clinical Course   Itching has improved.  Pt knows to return if worse.   Final Clinical Impressions(s) / ED Diagnoses   Final diagnoses:  Contact dermatitis    New Prescriptions New Prescriptions   HYDROXYZINE (ATARAX/VISTARIL) 25 MG TABLET    Take 1 tablet (25 mg total) by mouth every 6 (six) hours.   PREDNISONE (STERAPRED UNI-PAK 21 TAB) 10 MG (21) TBPK TABLET    Take 1 tablet (10 mg total) by mouth daily. Take 6 tabs by mouth daily  for 2 days, then 5 tabs for 2 days, then 4 tabs for 2 days, then 3 tabs for 2 days, 2 tabs for 2 days, then 1 tab by mouth daily for 2 days     Jacalyn Lefevre, MD 05/02/16 1214

## 2016-05-02 NOTE — ED Notes (Signed)
Pt states she was outside yesterday and then came in. Noticed bumps on the posterior aspect of her left knee. +itching. Some individual bumps in other areas.

## 2016-05-02 NOTE — ED Triage Notes (Signed)
C/o rash to legs and itching all over-started yesterday-NAD-steady gait

## 2016-05-02 NOTE — ED Notes (Signed)
MD at bedside. 

## 2016-06-24 ENCOUNTER — Encounter (HOSPITAL_BASED_OUTPATIENT_CLINIC_OR_DEPARTMENT_OTHER): Payer: Self-pay | Admitting: Emergency Medicine

## 2016-06-24 ENCOUNTER — Emergency Department (HOSPITAL_BASED_OUTPATIENT_CLINIC_OR_DEPARTMENT_OTHER)
Admission: EM | Admit: 2016-06-24 | Discharge: 2016-06-24 | Disposition: A | Discharge status: Home / Self Care | Payer: Managed Care, Other (non HMO) | Attending: Emergency Medicine | Admitting: Emergency Medicine

## 2016-06-24 DIAGNOSIS — F17290 Nicotine dependence, other tobacco product, uncomplicated: Secondary | ICD-10-CM | POA: Diagnosis not present

## 2016-06-24 DIAGNOSIS — K529 Noninfective gastroenteritis and colitis, unspecified: Secondary | ICD-10-CM | POA: Insufficient documentation

## 2016-06-24 DIAGNOSIS — N390 Urinary tract infection, site not specified: Secondary | ICD-10-CM | POA: Insufficient documentation

## 2016-06-24 DIAGNOSIS — R109 Unspecified abdominal pain: Secondary | ICD-10-CM | POA: Diagnosis present

## 2016-06-24 LAB — URINE MICROSCOPIC-ADD ON: RBC / HPF: NONE SEEN RBC/hpf (ref 0–5)

## 2016-06-24 LAB — URINALYSIS, ROUTINE W REFLEX MICROSCOPIC
BILIRUBIN URINE: NEGATIVE
GLUCOSE, UA: NEGATIVE mg/dL
HGB URINE DIPSTICK: NEGATIVE
KETONES UR: NEGATIVE mg/dL
NITRITE: NEGATIVE
Protein, ur: NEGATIVE mg/dL
SPECIFIC GRAVITY, URINE: 1.022 (ref 1.005–1.030)
pH: 5.5 (ref 5.0–8.0)

## 2016-06-24 LAB — PREGNANCY, URINE: Preg Test, Ur: NEGATIVE

## 2016-06-24 MED ORDER — ONDANSETRON 4 MG PO TBDP: 4.0000 mg | ORAL_TABLET | Freq: Three times a day (TID) | ORAL | 0 refills | Status: DC | PRN

## 2016-06-24 MED ORDER — SODIUM CHLORIDE 0.9 % IV BOLUS (SEPSIS): 1000.0000 mL | Freq: Once | INTRAVENOUS | Status: DC

## 2016-06-24 MED ORDER — ONDANSETRON HCL 4 MG/2ML IJ SOLN: 4.0000 mg | Freq: Once | INTRAMUSCULAR | Status: DC

## 2016-06-24 MED ORDER — DIPHENOXYLATE-ATROPINE 2.5-0.025 MG PO TABS: 1.0000 | ORAL_TABLET | Freq: Four times a day (QID) | ORAL | 0 refills | Status: DC | PRN

## 2016-06-24 MED ORDER — CEPHALEXIN 500 MG PO CAPS: 500.0000 mg | ORAL_CAPSULE | Freq: Three times a day (TID) | ORAL | 0 refills | Status: DC

## 2016-06-24 NOTE — ED Triage Notes (Addendum)
Patient reports Thursday night she began having vomiting, diarrhea, generalized abdominal pain with cramping.  States this began after eating and is unsure if this is the cause.

## 2016-06-24 NOTE — Discharge Instructions (Signed)
Take antibiotics as prescribed for your urinary tract infection. Your other symptoms are likely due to a virus or the food you ate. It sounds like you are getting better and your exam was very reassuring. I did give you some prescriptions to take as needed for nausea/vomiting or diarrhea. Return to the ER for new or worsening symptoms.

## 2016-06-24 NOTE — ED Provider Notes (Signed)
[facility]   CSN: [id number] Arrival date & time: 06/24/16  1826 By signing my name below, I, Levon HedgerElizabeth Hall, attest that this documentation has been prepared under the direction and in the presence of non-physician practitioner, Noelle PennerSerena Naiya Corral, PA-C  Electronically Signed: Levon HedgerElizabeth Hall, Scribe. 06/24/2016. 7:14 PM.   History   Chief Complaint Chief Complaint  Patient presents with  . Abdominal Pain   HPI Jessica Murray is a 25 y.o. female who presents to the Emergency Department complaining of intermittent, improvinglower abdominal pain onset two days ago. States her symptoms started after eating a sausage biscuit at work. She describes her pain as cramping. Pt states she is only in pain during the cramping episode and currently pain free. She also notes associated vomiting, nausea, and diarrhea 3-4 times yesterday though improving today. She also notes increased urinary frequency for one week. She has been able to stay hydrated and is tolerating PO, but notes decreased appetite. Pt states no one at work is experiencing these symptoms. She denies dysuria. Pt is not having any symptoms during exam.   The history is provided by the patient. No language interpreter was used.    History reviewed. No pertinent past medical history.  There are no active problems to display for this patient.   History reviewed. No pertinent surgical history.  OB History    No data available       Home Medications    Prior to Admission medications   Medication Sig Start Date End Date Taking? Authorizing Provider  hydrOXYzine (ATARAX/VISTARIL) 25 MG tablet Take 1 tablet (25 mg total) by mouth every 6 (six) hours. 05/02/16   Jacalyn LefevreJulie Haviland, MD  predniSONE (STERAPRED UNI-PAK 21 TAB) 10 MG (21) TBPK tablet Take 1 tablet (10 mg total) by mouth daily. Take 6 tabs by mouth daily  for 2 days, then 5 tabs for 2 days, then 4 tabs for 2 days, then 3 tabs for 2 days, 2 tabs for 2 days, then  1 tab by mouth daily for 2 days 05/02/16   Jacalyn LefevreJulie Haviland, MD    Family History History reviewed. No pertinent family history.  Social History Social History  Substance Use Topics  . Smoking status: Current Every Day Smoker    Types: Cigars  . Smokeless tobacco: Never Used  . Alcohol use No     Allergies   Review of patient's allergies indicates no known allergies.   Review of Systems Review of Systems  Gastrointestinal: Positive for abdominal pain, diarrhea, nausea and vomiting.  Genitourinary: Positive for frequency. Negative for dysuria.  All other systems reviewed and are negative.   Physical Exam Updated Vital Signs BP 125/90 (BP Location: Right Arm)   Pulse 94   Temp 98.8 F (37.1 C) (Oral)   Resp 18   Ht 6' (1.829 m)   Wt 280 lb (127 kg)   LMP 11/24/2015 (Exact Date)   SpO2 100%   BMI 37.97 kg/m   Physical Exam  Constitutional: She is oriented to person, place, and time.  HENT:  Right Ear: External ear normal.  Left Ear: External ear normal.  Nose: Nose normal.  Mouth/Throat: Oropharynx is clear and moist. No oropharyngeal exudate.  Eyes: Conjunctivae are normal.  Neck: Neck supple.  Cardiovascular: Normal rate, regular rhythm, normal heart sounds and intact distal pulses.   Pulmonary/Chest: Effort normal and breath sounds normal. No respiratory distress. She has no wheezes.  Abdominal: Soft. Bowel sounds are normal. She exhibits no distension. There  is no tenderness. There is no rebound and no guarding.  No CVA tenderness  Musculoskeletal: She exhibits no edema.  Lymphadenopathy:    She has no cervical adenopathy.  Neurological: She is alert and oriented to person, place, and time. No cranial nerve deficit.  Skin: Skin is warm and dry.  Psychiatric: She has a normal mood and affect.  Nursing note and vitals reviewed.  ED Treatments / Results  DIAGNOSTIC STUDIES:  Oxygen Saturation is 100% on RA, normal by my interpretation.    COORDINATION OF  CARE:  7:14 PM Discussed treatment plan with pt at bedside and pt agreed to plan.  Labs (all labs ordered are listed, but only abnormal results are displayed) Labs Reviewed  URINALYSIS, ROUTINE W REFLEX MICROSCOPIC (NOT AT [facility]) - Abnormal; Notable for the following:       Result Value   APPearance CLOUDY (*)    Leukocytes, UA TRACE (*)    All other components within normal limits  URINE MICROSCOPIC-ADD ON - Abnormal; Notable for the following:    Squamous Epithelial / LPF 6-30 (*)    Bacteria, UA MANY (*)    All other components within normal limits  URINE CULTURE  PREGNANCY, URINE    EKG  EKG Interpretation None       Radiology No results found.  Procedures Procedures (including critical care time)  Medications Ordered in ED Medications  ondansetron (ZOFRAN) injection 4 mg (not administered)  sodium chloride 0.9 % bolus 1,000 mL (not administered)     Initial Impression / Assessment and Plan / ED Course  I have reviewed the triage vital signs and the nursing notes.  Pertinent labs & imaging results that were available during my care of the patient were reviewed by me and considered in my medical decision making (see chart for details).  Clinical Course   Pt presenting with lower abdominal cramping with n/v/d that is now improving. She is nontoxic appearing. Has no abdominal findings on exam. She is tolerating PO and feeling improved compared to yesterday. She states she is terrified of needles and hesitant about bloodwork. I discussed with her that at this point the labs likely are not necessary. Suspect viral vs food contamination. Her UA does have evidence of contamination but also many bacteria. She endorses frequent urination so will treat as a UTI with course of keflex. ER return precautions given.  Final Clinical Impressions(s) / ED Diagnoses   Final diagnoses:  Gastroenteritis  Urinary tract infection without hematuria, site unspecified   I personally  performed the services described in this documentation, which was scribed in my presence. The recorded information has been reviewed and is accurate.   New Prescriptions Discharge Medication List as of 06/24/2016  8:09 PM    START taking these medications   Details  cephALEXin (KEFLEX) 500 MG capsule Take 1 capsule (500 mg total) by mouth 3 (three) times daily., Starting Sun 06/24/2016, Print    diphenoxylate-atropine (LOMOTIL) 2.5-0.025 MG tablet Take 1 tablet by mouth 4 (four) times daily as needed for diarrhea or loose stools., Starting Sun 06/24/2016, Print    ondansetron (ZOFRAN ODT) 4 MG disintegrating tablet Take 1 tablet (4 mg total) by mouth every 8 (eight) hours as needed for nausea or vomiting., Starting Sun 06/24/2016, Print         Carlene Coria, PA-C 06/25/16 1414    Melene Plan, DO 06/27/16 1519

## 2016-06-26 LAB — URINE CULTURE

## 2016-07-01 ENCOUNTER — Emergency Department (HOSPITAL_BASED_OUTPATIENT_CLINIC_OR_DEPARTMENT_OTHER): Payer: Managed Care, Other (non HMO)

## 2016-07-01 ENCOUNTER — Emergency Department (HOSPITAL_BASED_OUTPATIENT_CLINIC_OR_DEPARTMENT_OTHER)
Admission: EM | Admit: 2016-07-01 | Discharge: 2016-07-01 | Disposition: A | Discharge status: Home / Self Care | Payer: Managed Care, Other (non HMO) | Attending: Emergency Medicine | Admitting: Emergency Medicine

## 2016-07-01 ENCOUNTER — Encounter (HOSPITAL_BASED_OUTPATIENT_CLINIC_OR_DEPARTMENT_OTHER): Payer: Self-pay | Admitting: Emergency Medicine

## 2016-07-01 DIAGNOSIS — S93402A Sprain of unspecified ligament of left ankle, initial encounter: Secondary | ICD-10-CM | POA: Insufficient documentation

## 2016-07-01 DIAGNOSIS — Y929 Unspecified place or not applicable: Secondary | ICD-10-CM | POA: Diagnosis not present

## 2016-07-01 DIAGNOSIS — X58XXXA Exposure to other specified factors, initial encounter: Secondary | ICD-10-CM | POA: Insufficient documentation

## 2016-07-01 DIAGNOSIS — Y999 Unspecified external cause status: Secondary | ICD-10-CM | POA: Diagnosis not present

## 2016-07-01 DIAGNOSIS — F17290 Nicotine dependence, other tobacco product, uncomplicated: Secondary | ICD-10-CM | POA: Diagnosis not present

## 2016-07-01 DIAGNOSIS — Y939 Activity, unspecified: Secondary | ICD-10-CM | POA: Diagnosis not present

## 2016-07-01 DIAGNOSIS — S99912A Unspecified injury of left ankle, initial encounter: Secondary | ICD-10-CM | POA: Diagnosis present

## 2016-07-01 LAB — PREGNANCY, URINE: Preg Test, Ur: NEGATIVE

## 2016-07-01 NOTE — ED Triage Notes (Signed)
Patient states her left foot began swelling yesterday, denies injuries. Patient used ice which helped the swelling. Patient has not taken any medications for discomfort PTA. Patient ambulatory with slight limp to ER.

## 2016-07-01 NOTE — ED Notes (Signed)
MD at bedside. 

## 2016-07-01 NOTE — ED Provider Notes (Signed)
[facility]   CSN: [id number] Arrival date & time: 07/01/16  1354  By signing my name below, I, Phillis HaggisGabriella Gaje, attest that this documentation has been prepared under the direction and in the presence of Benjiman CoreNathan Lam Bjorklund, MD. Electronically Signed: Phillis HaggisGabriella Gaje, ED Scribe. 07/01/16. 4:16 PM.   History   Chief Complaint Chief Complaint  Patient presents with  . Foot Pain    left   The history is provided by the patient. No language interpreter was used.   HPI Comments: Jessica Murray is a 25 y.o. female who presents to the Emergency Department complaining of sudden onset, gradually worsening left foot and ankle swelling onset one day ago. Pt says that her foot felt tingly, looked down, and her foot was swollen. Pt is a smoker. She has not taken anything for her symptoms. She denies hx of similar symptoms. She denies injury or increased activity, use of BC or hormone therapy, chest pain, or SOB.   History reviewed. No pertinent past medical history.  There are no active problems to display for this patient.   History reviewed. No pertinent surgical history.  OB History    No data available       Home Medications    Prior to Admission medications   Medication Sig Start Date End Date Taking? Authorizing Provider  cephALEXin (KEFLEX) 500 MG capsule Take 1 capsule (500 mg total) by mouth 3 (three) times daily. 06/24/16   Ace GinsSerena Y Sam, PA-C  diphenoxylate-atropine (LOMOTIL) 2.5-0.025 MG tablet Take 1 tablet by mouth 4 (four) times daily as needed for diarrhea or loose stools. 06/24/16   Ace GinsSerena Y Sam, PA-C  hydrOXYzine (ATARAX/VISTARIL) 25 MG tablet Take 1 tablet (25 mg total) by mouth every 6 (six) hours. 05/02/16   Jacalyn LefevreJulie Haviland, MD  ondansetron (ZOFRAN ODT) 4 MG disintegrating tablet Take 1 tablet (4 mg total) by mouth every 8 (eight) hours as needed for nausea or vomiting. 06/24/16   Ace GinsSerena Y Sam, PA-C  predniSONE (STERAPRED UNI-PAK 21 TAB) 10 MG (21)  TBPK tablet Take 1 tablet (10 mg total) by mouth daily. Take 6 tabs by mouth daily  for 2 days, then 5 tabs for 2 days, then 4 tabs for 2 days, then 3 tabs for 2 days, 2 tabs for 2 days, then 1 tab by mouth daily for 2 days 05/02/16   Jacalyn LefevreJulie Haviland, MD    Family History History reviewed. No pertinent family history.  Social History Social History  Substance Use Topics  . Smoking status: Current Every Day Smoker    Types: Cigars  . Smokeless tobacco: Never Used  . Alcohol use No     Allergies   Review of patient's allergies indicates no known allergies.   Review of Systems Review of Systems  Cardiovascular: Positive for leg swelling.  Musculoskeletal: Positive for arthralgias.  All other systems reviewed and are negative.    Physical Exam Updated Vital Signs BP 125/79 (BP Location: Right Arm)   Pulse 90   Temp 98.4 F (36.9 C) (Oral)   Resp 20   Ht 5\' 9"  (1.753 m)   Wt 285 lb (129.3 kg)   LMP 11/24/2015   SpO2 100%   BMI 42.09 kg/m   Physical Exam  Constitutional: She is oriented to person, place, and time. She appears well-developed and well-nourished.  HENT:  Head: Normocephalic and atraumatic.  Neck: Normal range of motion. Neck supple.  Musculoskeletal: Normal range of motion.  Left lower leg/foot: Mild edema over  the left lower leg and over the foot; strong DP and PT pulses; paresthesias over the whole foot; tenderness over the medial malleolus posteriorly down to the heel; some pain with flexion and extension of the ankle, but not irritable  Neurological: She is alert and oriented to person, place, and time.  Skin: Skin is warm and dry.  Psychiatric: She has a normal mood and affect. Her behavior is normal.  Nursing note and vitals reviewed.    ED Treatments / Results  DIAGNOSTIC STUDIES: Oxygen Saturation is 100% on RA, normal by my interpretation.    COORDINATION OF CARE: 3:23 PM-Discussed treatment plan which includes labs and x-ray with pt at  bedside and pt agreed to plan.    Labs (all labs ordered are listed, but only abnormal results are displayed) Labs Reviewed  PREGNANCY, URINE    EKG  EKG Interpretation None       Radiology Dg Foot Complete Left  Result Date: 07/01/2016 CLINICAL DATA:  Sudden onset of gradually worsening left ankle and proximal foot swelling with foot tingling since 1 day ago. Denies injury. EXAM: LEFT FOOT - COMPLETE 3+ VIEW COMPARISON:  10/28/2013 FINDINGS: There is no evidence of fracture or dislocation. There is soft tissue induration and swelling of the visualized lower leg more so along its posterior and medial aspect. There is a prominent cornuate shaped type 3 navicular bone, a developmental variant but can contribute medial mid foot pain from bony prominence. Tiny dorsal calcaneal enthesophyte. Small bone island noted of the body of the calcaneus. IMPRESSION: 1. Nonspecific soft tissue induration posterior medially of the visualized distal left leg. No acute osseous abnormality. 2. 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 Tiny dorsal calcaneal enthesophyte. 4. No acute osseous abnormality. Electronically Signed   By: Tollie Eth M.D.   On: 07/01/2016 16:00    Procedures Procedures (including critical care time)  Medications Ordered in ED Medications - No data to display   Initial Impression / Assessment and Plan / ED Course  I have reviewed the triage vital signs and the nursing notes.  Pertinent labs & imaging results that were available during my care of the patient were reviewed by me and considered in my medical decision making (see chart for details).  Clinical Course    Patient with ankle swelling and pain. Does not seem to be irritable. Doubt infection. X-ray reassuring. Doubt DVT or similar. Will discharge home with sports medicine follow-up as needed.  Final Clinical Impressions(s) / ED Diagnoses   Final diagnoses:  Ankle sprain,  left, initial encounter  I personally performed the services described in this documentation, which was scribed in my presence. The recorded information has been reviewed and is accurate.     New Prescriptions New Prescriptions   No medications on file     Benjiman Core, MD 07/01/16 [phone]

## 2017-01-14 ENCOUNTER — Emergency Department (HOSPITAL_BASED_OUTPATIENT_CLINIC_OR_DEPARTMENT_OTHER)
Admission: EM | Admit: 2017-01-14 | Discharge: 2017-01-14 | Disposition: A | Discharge status: Home / Self Care | Payer: Self-pay | Attending: Emergency Medicine | Admitting: Emergency Medicine

## 2017-01-14 ENCOUNTER — Emergency Department (HOSPITAL_BASED_OUTPATIENT_CLINIC_OR_DEPARTMENT_OTHER): Payer: Self-pay

## 2017-01-14 ENCOUNTER — Encounter (HOSPITAL_BASED_OUTPATIENT_CLINIC_OR_DEPARTMENT_OTHER): Payer: Self-pay | Admitting: *Deleted

## 2017-01-14 DIAGNOSIS — M62831 Muscle spasm of calf: Secondary | ICD-10-CM | POA: Insufficient documentation

## 2017-01-14 DIAGNOSIS — F17290 Nicotine dependence, other tobacco product, uncomplicated: Secondary | ICD-10-CM | POA: Insufficient documentation

## 2017-01-14 DIAGNOSIS — Z79899 Other long term (current) drug therapy: Secondary | ICD-10-CM | POA: Insufficient documentation

## 2017-01-14 DIAGNOSIS — M25472 Effusion, left ankle: Secondary | ICD-10-CM

## 2017-01-14 MED ORDER — DIAZEPAM 5 MG PO TABS
5.0000 mg | ORAL_TABLET | Freq: Once | ORAL | Status: AC
  Administered 2017-01-14: 5 mg via ORAL
  Filled 2017-01-14: qty 1

## 2017-01-14 MED ORDER — CYCLOBENZAPRINE HCL 10 MG PO TABS: 10.0000 mg | ORAL_TABLET | Freq: Two times a day (BID) | ORAL | 0 refills | Status: DC | PRN

## 2017-01-14 MED ORDER — DIAZEPAM 5 MG PO TABS: 5.0000 mg | ORAL_TABLET | Freq: Three times a day (TID) | ORAL | 0 refills | Status: DC | PRN

## 2017-01-14 NOTE — ED Notes (Signed)
Unable to obtain vitals at this time due to patient is in Ultrasound

## 2017-01-14 NOTE — ED Notes (Signed)
Pt off the unit in US

## 2017-01-14 NOTE — ED Provider Notes (Signed)
[facility]   CSN: [id number] Arrival date & time: 01/14/17  1108     History   Chief Complaint Chief Complaint  Patient presents with  . Leg Pain    HPI Jessica Murray is a 26 y.o. female.  HPI   26 year old female presents with concern for left leg pain present over the last week, worsening over the last few days. Pain worsens with palpation and movement.  Reports tingling when someone touches it or with certain movements throughout her whole leg. Patient denies any action that resulted in this pain. No trauma.  Denies fevers, nausea, vomiting or diarrhea. Denies chest pain or shortness of breath. She is not on birth control. LMP more than one year ago, declines pregnancy test.   History reviewed. No pertinent past medical history.  There are no active problems to display for this patient.   History reviewed. No pertinent surgical history.  OB History    No data available       Home Medications    Prior to Admission medications   Medication Sig Start Date End Date Taking? Authorizing Provider  cephALEXin (KEFLEX) 500 MG capsule Take 1 capsule (500 mg total) by mouth 3 (three) times daily. 06/24/16   Ace Gins Sam, PA-C  cyclobenzaprine (FLEXERIL) 10 MG tablet Take 1 tablet (10 mg total) by mouth 2 (two) times daily as needed for muscle spasms. 01/14/17   Alvira Monday, MD  diazepam (VALIUM) 5 MG tablet Take 1 tablet (5 mg total) by mouth every 8 (eight) hours as needed for anxiety or muscle spasms. 01/14/17   Alvira Monday, MD  diphenoxylate-atropine (LOMOTIL) 2.5-0.025 MG tablet Take 1 tablet by mouth 4 (four) times daily as needed for diarrhea or loose stools. 06/24/16   Ace Gins Sam, PA-C  hydrOXYzine (ATARAX/VISTARIL) 25 MG tablet Take 1 tablet (25 mg total) by mouth every 6 (six) hours. 05/02/16   Jacalyn Lefevre, MD  ondansetron (ZOFRAN ODT) 4 MG disintegrating tablet Take 1 tablet (4 mg total) by mouth every 8 (eight) hours as needed for  nausea or vomiting. 06/24/16   Ace Gins Sam, PA-C  predniSONE (STERAPRED UNI-PAK 21 TAB) 10 MG (21) TBPK tablet Take 1 tablet (10 mg total) by mouth daily. Take 6 tabs by mouth daily  for 2 days, then 5 tabs for 2 days, then 4 tabs for 2 days, then 3 tabs for 2 days, 2 tabs for 2 days, then 1 tab by mouth daily for 2 days 05/02/16   Jacalyn Lefevre, MD    Family History No family history on file.  Social History Social History  Substance Use Topics  . Smoking status: Current Every Day Smoker    Types: Cigars  . Smokeless tobacco: Never Used  . Alcohol use No     Allergies   Patient has no known allergies.   Review of Systems Review of Systems  Constitutional: Negative for fever.  HENT: Negative for sore throat.   Eyes: Negative for visual disturbance.  Respiratory: Negative for cough and shortness of breath.   Cardiovascular: Negative for chest pain.  Gastrointestinal: Negative for abdominal pain, nausea and vomiting.  Genitourinary: Negative for difficulty urinating.  Musculoskeletal: Positive for arthralgias, gait problem and myalgias. Negative for back pain and neck pain.  Skin: Negative for rash.  Neurological: Negative for syncope and headaches.     Physical Exam Updated Vital Signs BP 121/78 (BP Location: Right Arm)   Pulse 83   Temp 98.3 F (36.8 C) (  Oral)   Resp 18   Ht 6' (1.829 m)   Wt 285 lb (129.3 kg)   SpO2 99%   BMI 38.65 kg/m   Physical Exam  Constitutional: She is oriented to person, place, and time. She appears well-developed and well-nourished. No distress.  HENT:  Head: Normocephalic and atraumatic.  Eyes: Conjunctivae and EOM are normal.  Neck: Normal range of motion.  Cardiovascular: Normal rate, regular rhythm, normal heart sounds and intact distal pulses.  Exam reveals no gallop and no friction rub.   No murmur heard. Pulmonary/Chest: Effort normal and breath sounds normal. No respiratory distress. She has no wheezes. She has no rales.    Abdominal: Soft. She exhibits no distension. There is no tenderness. There is no guarding.  Musculoskeletal: She exhibits no edema or tenderness.  Mild swelling of dorsum of left foot and ankle Tenderness of calf, foot and ankle Calf muscle in spasm Reports parasthesias with palpation 2+DP and PT pulses bilaterally Normal ROM of knee, hip and ankle No erythema  No groin swelling  Neurological: She is alert and oriented to person, place, and time.  Skin: Skin is warm and dry. No rash noted. She is not diaphoretic. No erythema.  Nursing note and vitals reviewed.    ED Treatments / Results  Labs (all labs ordered are listed, but only abnormal results are displayed) Labs Reviewed - No data to display  EKG  EKG Interpretation None       Radiology US Venous Img Lower Unilateral Left  Result Date: 01/14/2017 CLINICAL DATA:  Left lower leg numbness and tingling for 1 week. EXAM: LEFT LOWER EXTREMITY VENOUS DOPPLER ULTRASOUND TECHNIQUE: Gray-scale sonography with graded compression, as well as color Doppler and duplex ultrasound were performed to evaluate the lower extremity deep venous systems from the level of the common femoral vein and including the common femoral, femoral, profunda femoral, popliteal and calf veins including the posterior tibial, peroneal and gastrocnemius veins when visible. The superficial great saphenous vein was also interrogated. Spectral Doppler was utilized to evaluate flow at rest and with distal augmentation maneuvers in the common femoral, femoral and popliteal veins. COMPARISON:  None. FINDINGS: Contralateral Common Femoral Vein: Respiratory phasicity is normal and symmetric with the symptomatic side. No evidence of thrombus. Normal compressibility. Common Femoral Vein: No evidence of thrombus. Normal compressibility, respiratory phasicity and response to augmentation. Saphenofemoral Junction: No evidence of thrombus. Normal compressibility and flow on color  Doppler imaging. Profunda Femoral Vein: No evidence of thrombus. Normal compressibility and flow on color Doppler imaging. Femoral Vein: No evidence of thrombus. Normal compressibility, respiratory phasicity and response to augmentation. Popliteal Vein: No evidence of thrombus. Normal compressibility, respiratory phasicity and response to augmentation. Calf Veins: No evidence of thrombus. Normal compressibility and flow on color Doppler imaging. Superficial Great Saphenous Vein: No evidence of thrombus. Normal compressibility and flow on color Doppler imaging. Venous Reflux:  None. Other Findings:  None. IMPRESSION: No evidence of deep venous thrombosis. Electronically Signed   By: Drusilla Kanner M.D.   On: 01/14/2017 13:37    Procedures Procedures (including critical care time)  Medications Ordered in ED Medications  diazepam (VALIUM) tablet 5 mg (5 mg Oral Given 01/14/17 1201)     Initial Impression / Assessment and Plan / ED Course  I have reviewed the triage vital signs and the nursing notes.  Pertinent labs & imaging results that were available during my care of the patient were reviewed by me and considered in my medical decision  making (see chart for details).     34 her old female in no significant medical history presents with concern for left lower extremity pain and swelling. Patient has 2+ DP and PT pulses bilaterally, have low suspicion for acute arterial occlusion. EVT ultrasound was completed and was negative. There is no sign of infection. No history to suggest trauma or fracture. She does have signs of spasm in her left gastrocnemius muscle. She is given Valium in the emergency department with improvement. She is discharged with a prescription for Valium, and Flexeril if she is not getting relief. She is given crutches to use for comfort. Do not have explanation for this swelling present in her leg at this time, however doubt more proximal DVT. Doubt significant electrolyte  abnormality as etiology of cramping in a young healthy patient who is not on any other medications, has no vomiting or diarrhea and patient agrees with no labwork today.  Patient also reports a sensation of numbness, when people touch her leg. Given the development of these symptoms with palpation, rather than spontaneously, no other neurologic signs or symptoms, no back pain, have low suspicion for acute spinal pathology or CVA as etiology of these symptoms. Of note, patient had presented in September for concern of atraumatic left lower extremity swelling with parasthesias and was diagnosed with ankle sprain at that time with improvement.  Discussed with patient that I do not see any emergent etiology of her leg pain or swelling, however recommend close PCP follow-up regarding symptoms. Case management request was placed to assist in organizing PCP follow up.   Final Clinical Impressions(s) / ED Diagnoses   Final diagnoses:  Left ankle swelling  Muscle spasm of calf    New Prescriptions New Prescriptions   CYCLOBENZAPRINE (FLEXERIL) 10 MG TABLET    Take 1 tablet (10 mg total) by mouth 2 (two) times daily as needed for muscle spasms.   DIAZEPAM (VALIUM) 5 MG TABLET    Take 1 tablet (5 mg total) by mouth every 8 (eight) hours as needed for anxiety or muscle spasms.     Alvira Monday, MD 01/14/17 [phone]

## 2017-01-14 NOTE — ED Notes (Signed)
Pt teaching provided on medications that may cause drowsiness. Pt instructed not to drive or operate heavy machinery while taking the prescribed medication. Pt verbalized understanding.   

## 2017-01-14 NOTE — ED Triage Notes (Signed)
Pain, pressure, and swelling in her left lower leg for a week. States she cannot feel her foot x 5 days. Both feet are cool to touch. Weak pedal pulse.

## 2017-07-19 ENCOUNTER — Encounter (HOSPITAL_BASED_OUTPATIENT_CLINIC_OR_DEPARTMENT_OTHER): Payer: Self-pay | Admitting: Emergency Medicine

## 2017-07-19 ENCOUNTER — Emergency Department (HOSPITAL_BASED_OUTPATIENT_CLINIC_OR_DEPARTMENT_OTHER)
Admission: EM | Admit: 2017-07-19 | Discharge: 2017-07-19 | Disposition: A | Discharge status: Home / Self Care | Payer: Self-pay | Attending: Emergency Medicine | Admitting: Emergency Medicine

## 2017-07-19 DIAGNOSIS — N644 Mastodynia: Secondary | ICD-10-CM | POA: Insufficient documentation

## 2017-07-19 DIAGNOSIS — F17290 Nicotine dependence, other tobacco product, uncomplicated: Secondary | ICD-10-CM | POA: Insufficient documentation

## 2017-07-19 DIAGNOSIS — N898 Other specified noninflammatory disorders of vagina: Secondary | ICD-10-CM | POA: Insufficient documentation

## 2017-07-19 DIAGNOSIS — R030 Elevated blood-pressure reading, without diagnosis of hypertension: Secondary | ICD-10-CM | POA: Insufficient documentation

## 2017-07-19 LAB — URINALYSIS, ROUTINE W REFLEX MICROSCOPIC
Bilirubin Urine: NEGATIVE
GLUCOSE, UA: NEGATIVE mg/dL
Hgb urine dipstick: NEGATIVE
Ketones, ur: NEGATIVE mg/dL
Leukocytes, UA: NEGATIVE
Nitrite: NEGATIVE
PH: 6 (ref 5.0–8.0)
Protein, ur: NEGATIVE mg/dL
Specific Gravity, Urine: 1.025 (ref 1.005–1.030)

## 2017-07-19 LAB — PREGNANCY, URINE: Preg Test, Ur: NEGATIVE

## 2017-07-19 NOTE — Discharge Instructions (Signed)
Contact a health care provider if:  Any part of your breast is hard, red, and hot to the touch. This may be a sign of infection.  You are not breastfeeding and you have fluid, especially blood or pus, coming out of your nipples.  You have a fever.  You have a new or painful lump in your breast that remains after your menstrual period ends.  Your pain does not improve or it gets worse.  Your pain is interfering with your daily activities.

## 2017-07-19 NOTE — ED Triage Notes (Signed)
Bilateral nipple soreness with clear vaginal discharge for one week.  No signs of infection.

## 2017-07-19 NOTE — ED Provider Notes (Signed)
[facility]   CSN: [id number] Arrival date & time: 07/19/17  0941     History   Chief Complaint Chief Complaint  Patient presents with  . Breast Pain    HPI Jessica Murray is a 26 y.o. female who presents emergency Department with chief complaint of bilateral nipple soreness. She states it began yesterday. It is common for this to occur right before she begins to menstruate. Patient is late on her period which should have begun on 10/5/ 2018. Her last menstrual period was in September 5 of 2018. The patient noticed some clear discharge from her vagina yesterday. She states it is not bothering her. She denies any risk for sexually transmitted illnesses. Patient is declining pelvic examination at this time. She denies nipple discharge, changes in her skin texture, fevers or chills.  HPI  No past medical history on file.  There are no active problems to display for this patient.   No past surgical history on file.  OB History    No data available       Home Medications    Prior to Admission medications   Not on File    Family History No family history on file.  Social History Social History  Substance Use Topics  . Smoking status: Current Every Day Smoker    Types: Cigars  . Smokeless tobacco: Never Used  . Alcohol use No     Allergies   Patient has no known allergies.   Review of Systems Review of Systems Ten systems reviewed and are negative for acute change, except as noted in the HPI.    Physical Exam Updated Vital Signs BP (!) 139/96   Pulse 99   Temp 98.5 F (36.9 C)   Resp 16   Ht 6' (1.829 m)   Wt 127 kg (280 lb)   LMP 06/12/2017   SpO2 100%   BMI 37.97 kg/m   Physical Exam  Constitutional: She is oriented to person, place, and time. She appears well-developed and well-nourished. No distress.  HENT:  Head: Normocephalic and atraumatic.  Eyes: Conjunctivae are normal. No scleral icterus.  Neck: Normal  range of motion.  Cardiovascular: Normal rate, regular rhythm and normal heart sounds.  Exam reveals no gallop and no friction rub.   No murmur heard. Pulmonary/Chest: Effort normal and breath sounds normal. No respiratory distress. Right breast exhibits no mass, no nipple discharge and no skin change. Left breast exhibits no mass, no nipple discharge and no skin change. There is no breast swelling.    Abdominal: Soft. Bowel sounds are normal. She exhibits no distension and no mass. There is no tenderness. There is no guarding.  Genitourinary: No breast tenderness, discharge or bleeding.  Neurological: She is alert and oriented to person, place, and time.  Skin: Skin is warm and dry. She is not diaphoretic.  Psychiatric: Her behavior is normal.  Nursing note and vitals reviewed.    ED Treatments / Results  Labs (all labs ordered are listed, but only abnormal results are displayed) Labs Reviewed  URINALYSIS, ROUTINE W REFLEX MICROSCOPIC - Abnormal; Notable for the following:       Result Value   APPearance HAZY (*)    All other components within normal limits  PREGNANCY, URINE    EKG  EKG Interpretation None       Radiology No results found.  Procedures Procedures (including critical care time)  Medications Ordered in ED Medications - No data to display  Initial Impression / Assessment and Plan / ED Course  I have reviewed the triage vital signs and the nursing notes.  Pertinent labs & imaging results that were available during my care of the patient were reviewed by me and considered in my medical decision making (see chart for details).     Patient of breast tenderness likely hormonally were regulated. Patient likely going to begin menstruation soon. No evidence of infection or masses. Patient declines a pelvic examination. I have advised patient to follow up with OB/GYN and discussed return precautions. She appears safe for discharge at this time. Her urinalysis  is negative and she did does not have a positive urine pregnancy at this time. Final Clinical Impressions(s) / ED Diagnoses   Final diagnoses:  None    New Prescriptions New Prescriptions   No medications on file     Arthor Captain, PA-C 07/19/17 1318    Rolan Bucco, MD 07/19/17 1523

## 2018-02-28 ENCOUNTER — Encounter (HOSPITAL_BASED_OUTPATIENT_CLINIC_OR_DEPARTMENT_OTHER): Payer: Self-pay | Admitting: *Deleted

## 2018-02-28 ENCOUNTER — Other Ambulatory Visit: Payer: Self-pay

## 2018-02-28 ENCOUNTER — Emergency Department (HOSPITAL_BASED_OUTPATIENT_CLINIC_OR_DEPARTMENT_OTHER)
Admission: EM | Admit: 2018-02-28 | Discharge: 2018-02-28 | Disposition: A | Discharge status: Home / Self Care | Payer: BLUE CROSS/BLUE SHIELD | Attending: Emergency Medicine | Admitting: Emergency Medicine

## 2018-02-28 DIAGNOSIS — F17290 Nicotine dependence, other tobacco product, uncomplicated: Secondary | ICD-10-CM | POA: Diagnosis not present

## 2018-02-28 DIAGNOSIS — M79672 Pain in left foot: Secondary | ICD-10-CM | POA: Diagnosis not present

## 2018-02-28 MED ORDER — PREDNISONE 20 MG PO TABS: 40.0000 mg | ORAL_TABLET | Freq: Every day | ORAL | 0 refills | Status: AC

## 2018-02-28 MED ORDER — ACETAMINOPHEN 325 MG PO TABS
650.0000 mg | ORAL_TABLET | Freq: Once | ORAL | Status: AC
  Administered 2018-02-28: 650 mg via ORAL
  Filled 2018-02-28: qty 2

## 2018-02-28 MED ORDER — PREDNISONE 50 MG PO TABS
60.0000 mg | ORAL_TABLET | ORAL | Status: AC
  Administered 2018-02-28: 60 mg via ORAL
  Filled 2018-02-28: qty 1

## 2018-02-28 NOTE — ED Triage Notes (Signed)
Pt states she has had swelling in her left foot since April of last year with ongoing pain, has been evaluated for the same previously. States pain is worse since Monday and also having left leg and left arm pain. No recent injury. No meds PTA.

## 2018-02-28 NOTE — Discharge Instructions (Signed)
As discussed, your evaluation today has been largely reassuring.  But, it is important that you monitor your condition carefully, and do not hesitate to return to the ED if you develop new, or concerning changes in your condition. ? ?Otherwise, please follow-up with your physician for appropriate ongoing care. ? ?

## 2018-02-28 NOTE — ED Notes (Signed)
ED Provider at bedside. 

## 2018-02-28 NOTE — ED Provider Notes (Signed)
[facility] EMERGENCY DEPARTMENT Provider Note   CSN: [id number] Arrival date & time: 02/28/18  2209     History   Chief Complaint Chief Complaint  Patient presents with  . Foot Pain    HPI Jessica Murray is a 27 y.o. female.  HPI Patient presents with concern of left foot pain. She does have some pain in her left arm, but notes that her pain in her left foot has worse than usual. Pain is been present for 1 year at least, she has seen 6 physicians during that interval, had multiple tests, blood work, imaging, and is scheduled to see vascular surgery in 2 weeks. Without clear the patient the pain became worse earlier today, without notable change in swelling. No dyspnea, fever, chills, chest pain. Additional pain seems inconsistent, occasional sharp bursts of pain in her left forearm, left upper leg.    History reviewed. No pertinent past medical history.  There are no active problems to display for this patient.   History reviewed. No pertinent surgical history.   OB History   None      Home Medications    Prior to Admission medications   Medication Sig Start Date End Date Taking? Authorizing Provider  predniSONE (DELTASONE) 20 MG tablet Take 2 tablets (40 mg total) by mouth daily with breakfast. For the next four days 02/28/18   Gerhard Munch, MD    Family History No family history on file.  Social History Social History   Tobacco Use  . Smoking status: Current Every Day Smoker    Types: Cigars  . Smokeless tobacco: Never Used  Substance Use Topics  . Alcohol use: No  . Drug use: No     Allergies   Patient has no known allergies.   Review of Systems Review of Systems  Constitutional:       Per HPI, otherwise negative  HENT:       Per HPI, otherwise negative  Respiratory:       Per HPI, otherwise negative  Cardiovascular:       Per HPI, otherwise negative  Gastrointestinal: Negative for vomiting.  Endocrine:       Negative  aside from HPI  Genitourinary:       Neg aside from HPI   Musculoskeletal:       Per HPI, otherwise negative  Skin: Negative.   Allergic/Immunologic: Negative for immunocompromised state.  Neurological: Negative for syncope, weakness and numbness.     Physical Exam Updated Vital Signs BP 131/89 (BP Location: Right Arm)   Pulse 93   Temp 98.3 F (36.8 C) (Oral)   Resp 20   Ht  (1.803 m)   Wt 120.2 kg (265 lb)   SpO2 100%   BMI 36.96 kg/m   Physical Exam  Constitutional: She is oriented to person, place, and time. She appears well-developed and well-nourished. No distress.  HENT:  Head: Normocephalic and atraumatic.  Eyes: Conjunctivae and EOM are normal.  Cardiovascular: Normal rate and regular rhythm.  Pulmonary/Chest: Effort normal and breath sounds normal. No stridor. No respiratory distress.  Abdominal: She exhibits no distension.  Musculoskeletal: She exhibits edema.  Neurological: She is alert and oriented to person, place, and time. No cranial nerve deficit.  Skin: Skin is warm and dry.  Psychiatric: She has a normal mood and affect.  Nursing note and vitals reviewed.    ED Treatments / Results   Procedures Procedures (including critical care time)  Medications Ordered in ED Medications  acetaminophen (TYLENOL) tablet 650 mg (has no administration in time range)  predniSONE (DELTASONE) tablet 60 mg (has no administration in time range)     Initial Impression / Assessment and Plan / ED Course  I have reviewed the triage vital signs and the nursing notes.  Pertinent labs & imaging results that were available during my care of the patient were reviewed by me and considered in my medical decision making (see chart for details).    Well-appearing female with chronic left foot swelling and pain presents with left foot swelling and pain. Patient is awake, alert, afebrile, no cutaneous changes suggesting new infection, no vital sign abnormalities or  complaints suggesting DVT or PE. Patient has had thorough evaluation in the past, is scheduled for vascular follow-up. With goal of new therapy, patient has not tried steroids, will start a short course of these, with Tylenol for pain control.   Final Clinical Impressions(s) / ED Diagnoses   Final diagnoses:  Foot pain, left    ED Discharge Orders        Ordered    predniSONE (DELTASONE) 20 MG tablet  Daily with breakfast     02/28/18 2344       Gerhard Munch, MD 02/28/18 2350

## 2018-05-28 ENCOUNTER — Emergency Department (HOSPITAL_BASED_OUTPATIENT_CLINIC_OR_DEPARTMENT_OTHER)
Admission: EM | Admit: 2018-05-28 | Discharge: 2018-05-29 | Disposition: A | Discharge status: Home / Self Care | Payer: BLUE CROSS/BLUE SHIELD | Attending: Emergency Medicine | Admitting: Emergency Medicine

## 2018-05-28 ENCOUNTER — Emergency Department (HOSPITAL_BASED_OUTPATIENT_CLINIC_OR_DEPARTMENT_OTHER): Payer: BLUE CROSS/BLUE SHIELD

## 2018-05-28 ENCOUNTER — Other Ambulatory Visit: Payer: Self-pay

## 2018-05-28 ENCOUNTER — Encounter (HOSPITAL_BASED_OUTPATIENT_CLINIC_OR_DEPARTMENT_OTHER): Payer: Self-pay

## 2018-05-28 DIAGNOSIS — F17290 Nicotine dependence, other tobacco product, uncomplicated: Secondary | ICD-10-CM | POA: Diagnosis not present

## 2018-05-28 DIAGNOSIS — Z79899 Other long term (current) drug therapy: Secondary | ICD-10-CM | POA: Diagnosis not present

## 2018-05-28 DIAGNOSIS — R05 Cough: Secondary | ICD-10-CM

## 2018-05-28 DIAGNOSIS — R69 Illness, unspecified: Secondary | ICD-10-CM

## 2018-05-28 DIAGNOSIS — R059 Cough, unspecified: Secondary | ICD-10-CM

## 2018-05-28 DIAGNOSIS — J111 Influenza due to unidentified influenza virus with other respiratory manifestations: Secondary | ICD-10-CM | POA: Insufficient documentation

## 2018-05-28 MED ORDER — LACTATED RINGERS IV BOLUS
1000.0000 mL | Freq: Once | INTRAVENOUS | Status: AC
  Administered 2018-05-28: 1000 mL via INTRAVENOUS

## 2018-05-28 MED ORDER — KETOROLAC TROMETHAMINE 30 MG/ML IJ SOLN
15.0000 mg | Freq: Once | INTRAMUSCULAR | Status: AC
  Administered 2018-05-28: 15 mg via INTRAVENOUS
  Filled 2018-05-28: qty 1

## 2018-05-28 NOTE — ED Provider Notes (Signed)
[facility] EMERGENCY DEPARTMENT Provider Note   CSN: [id number] Arrival date & time: 05/28/18  2127     History   Chief Complaint Chief Complaint  Patient presents with  . Cough    HPI Jessica Murray is a 27 y.o. female.   Cough  This is a new problem. The current episode started more than 2 days ago. The problem occurs hourly. The problem has not changed since onset.The cough is productive of sputum. The maximum temperature recorded prior to her arrival was 100 to 100.9 F. Associated symptoms include chills, ear congestion, rhinorrhea, sore throat, myalgias and shortness of breath. She has tried nothing for the symptoms. The treatment provided no relief. Risk factors include travel to endemic areas. She is not a smoker. Her past medical history does not include pneumonia.    History reviewed. No pertinent past medical history.  There are no active problems to display for this patient.   History reviewed. No pertinent surgical history.   OB History   None      Home Medications    Prior to Admission medications   Medication Sig Start Date End Date Taking? Authorizing Provider  medroxyPROGESTERone (PROVERA) 10 MG tablet Take by mouth. 03/18/18  Yes [provider]  predniSONE (DELTASONE) 20 MG tablet Take 2 tablets (40 mg total) by mouth daily with breakfast. For the next four days 02/28/18   Gerhard MunchLockwood, Robert, MD    Family History No family history on file.  Social History Social History   Tobacco Use  . Smoking status: Current Every Day Smoker    Types: Cigars  . Smokeless tobacco: Never Used  Substance Use Topics  . Alcohol use: No  . Drug use: No     Allergies   Patient has no known allergies.   Review of Systems Review of Systems  Constitutional: Positive for chills.  HENT: Positive for rhinorrhea and sore throat.   Respiratory: Positive for cough and shortness of breath.   Cardiovascular: Positive for leg swelling (chronic).    Gastrointestinal: Negative for abdominal pain.  Musculoskeletal: Positive for myalgias.  All other systems reviewed and are negative.    Physical Exam Updated Vital Signs BP 125/68 (BP Location: Right Arm)   Pulse (!) 116   Temp 100 F (37.8 C) (Oral)   Resp 18   Ht 5\' 11"  (1.803 m)   Wt 120.2 kg   LMP 04/22/2018   SpO2 98%   BMI 36.96 kg/m   Physical Exam  Constitutional: She is oriented to person, place, and time. She appears well-developed and well-nourished.  HENT:  Head: Normocephalic and atraumatic.  Eyes: Conjunctivae and EOM are normal.  Neck: Normal range of motion.  Cardiovascular: Normal rate and regular rhythm.  Pulmonary/Chest: Effort normal and breath sounds normal. No stridor. No respiratory distress.  Abdominal: Soft. Bowel sounds are normal. She exhibits no distension.  Musculoskeletal: Normal range of motion. She exhibits no edema or deformity.  Neurological: She is alert and oriented to person, place, and time. No cranial nerve deficit. Coordination normal.  Skin: Skin is warm and dry. No erythema. No pallor.  Nursing note and vitals reviewed.    ED Treatments / Results  Labs (all labs ordered are listed, but only abnormal results are displayed) Labs Reviewed - No data to display  EKG None  Radiology Dg Chest 2 View  Result Date: 05/28/2018 CLINICAL DATA:  Fever and cough for several days EXAM: CHEST - 2 VIEW COMPARISON:  01/01/2015 FINDINGS: Cardiac  shadow is within normal limits. The lungs are well aerated bilaterally. No focal infiltrate or sizable effusion is noted. No bony abnormality is seen. IMPRESSION: No active cardiopulmonary disease. Electronically Signed   By: Alcide CleverMark  Lukens M.D.   On: 05/28/2018 22:00    Procedures Procedures (including critical care time)  Medications Ordered in ED Medications  lactated ringers bolus 1,000 mL (1,000 mLs Intravenous New Bag/Given 05/28/18 2308)  ketorolac (TORADOL) 30 MG/ML injection 15 mg (15 mg  Intravenous Given 05/28/18 2305)     Initial Impression / Assessment and Plan / ED Course  I have reviewed the triage vital signs and the nursing notes.  Pertinent labs & imaging results that were available during my care of the patient were reviewed by me and considered in my medical decision making (see chart for details).     S/S c/w viral infection of some sort, possibly influenza as it is more common in the summer this year than previous years.  Either way she is out of the window for any type of treatment for that and this does not seem to be bacterial in nature so that supportive care.  Tachycardic but is likely related to fever and dehydration so we will give her some fluids and Toradol but at this point stable for discharge home with supportive care  Medically improved.  Heart rate improved.  Will finish her fluids and discharge home with PCP follow-up as needed.  Final Clinical Impressions(s) / ED Diagnoses   Final diagnoses:  Cough  Influenza-like illness    ED Discharge Orders    None       Ane Conerly, Barbara CowerJason, MD 05/28/18 2352

## 2018-05-28 NOTE — ED Triage Notes (Signed)
C/o flu like sx x 3 days-NAD-to triage in w/c

## 2018-05-28 NOTE — ED Notes (Signed)
Patient transported to X-ray 

## 2019-03-08 IMAGING — DX DG CHEST 2V
2 series · 2 of 2 positions shown · non-contrast
Comparison: 01/01/2015

CLINICAL DATA: Fever and cough for several days

EXAM:
CHEST - 2 VIEW

[chest pa]
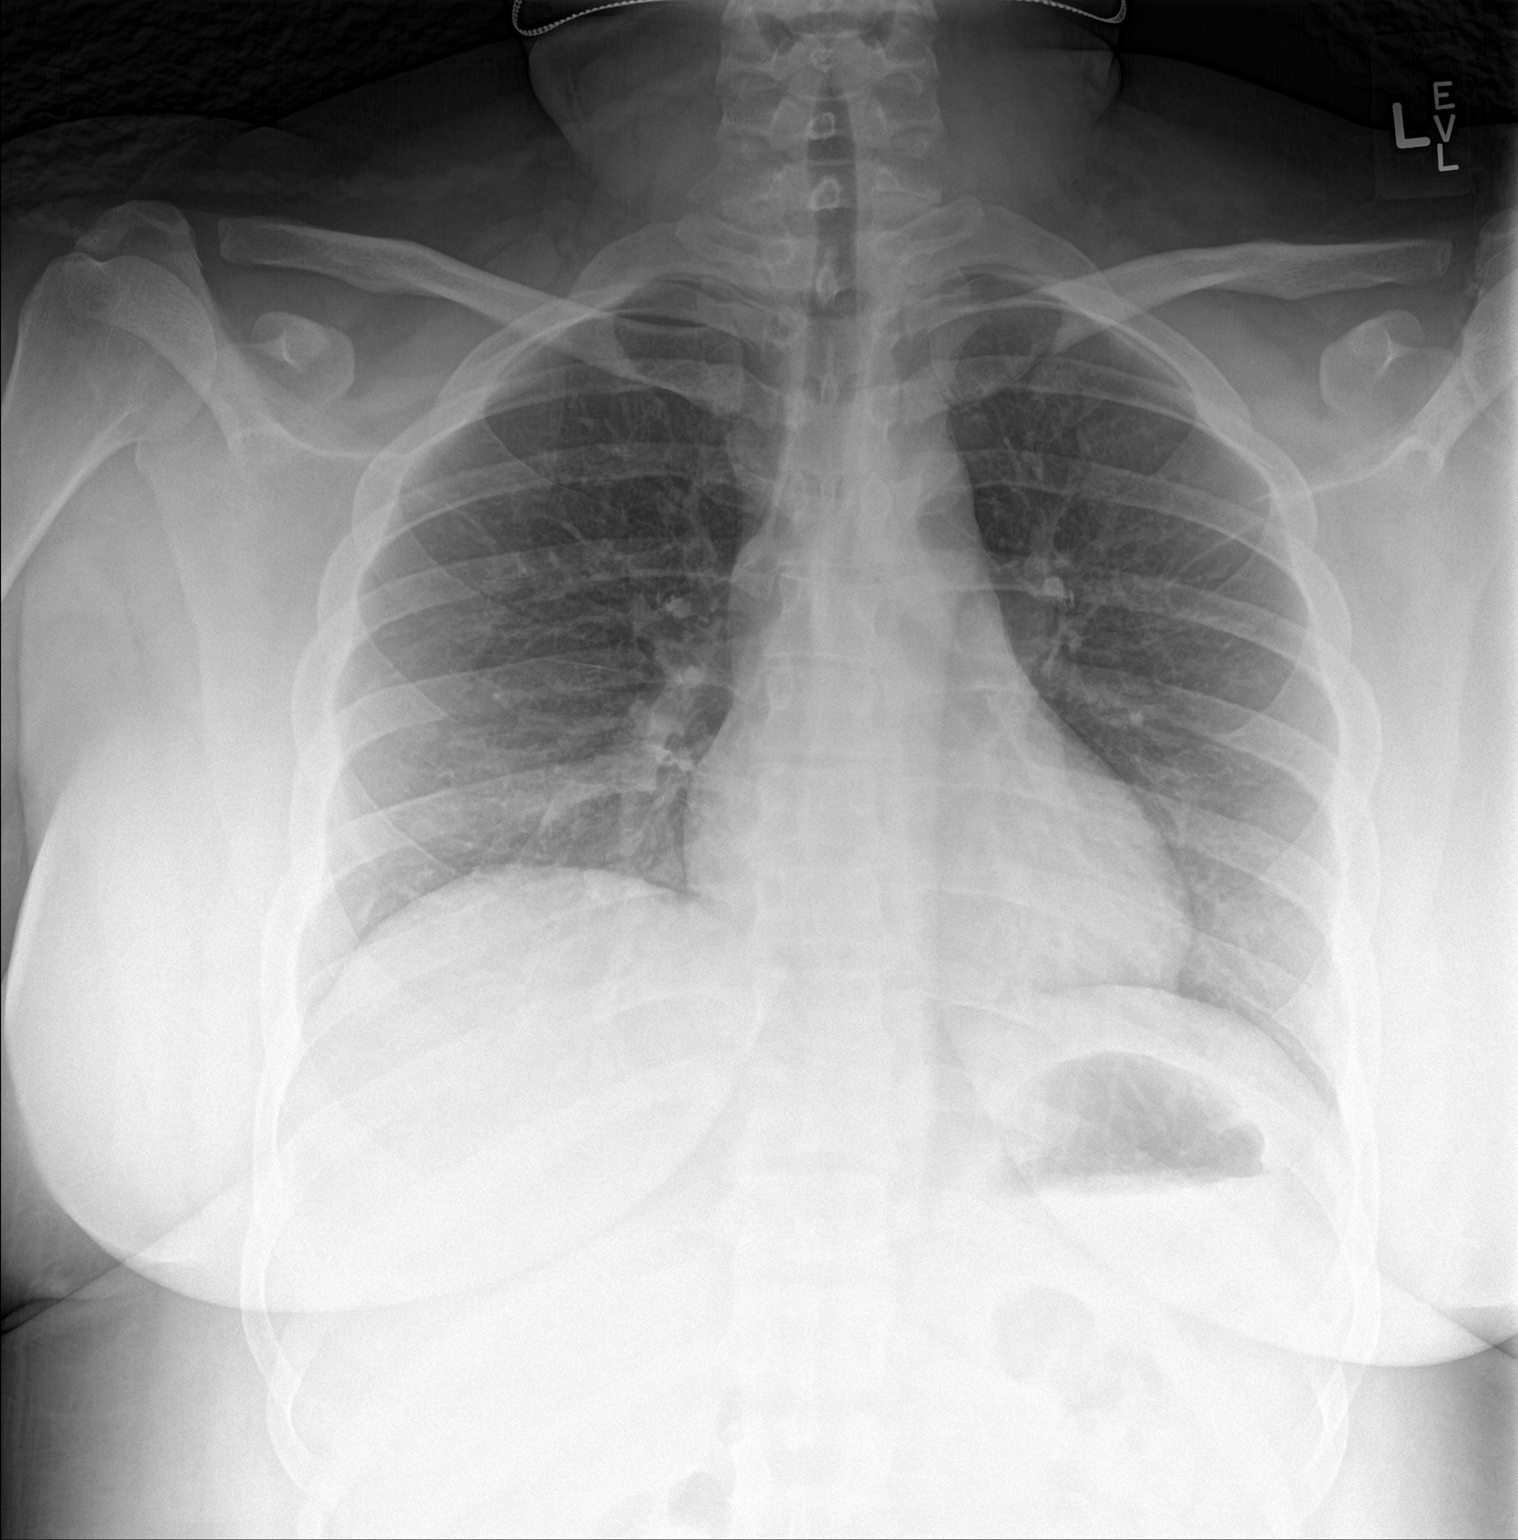

[chest lat]
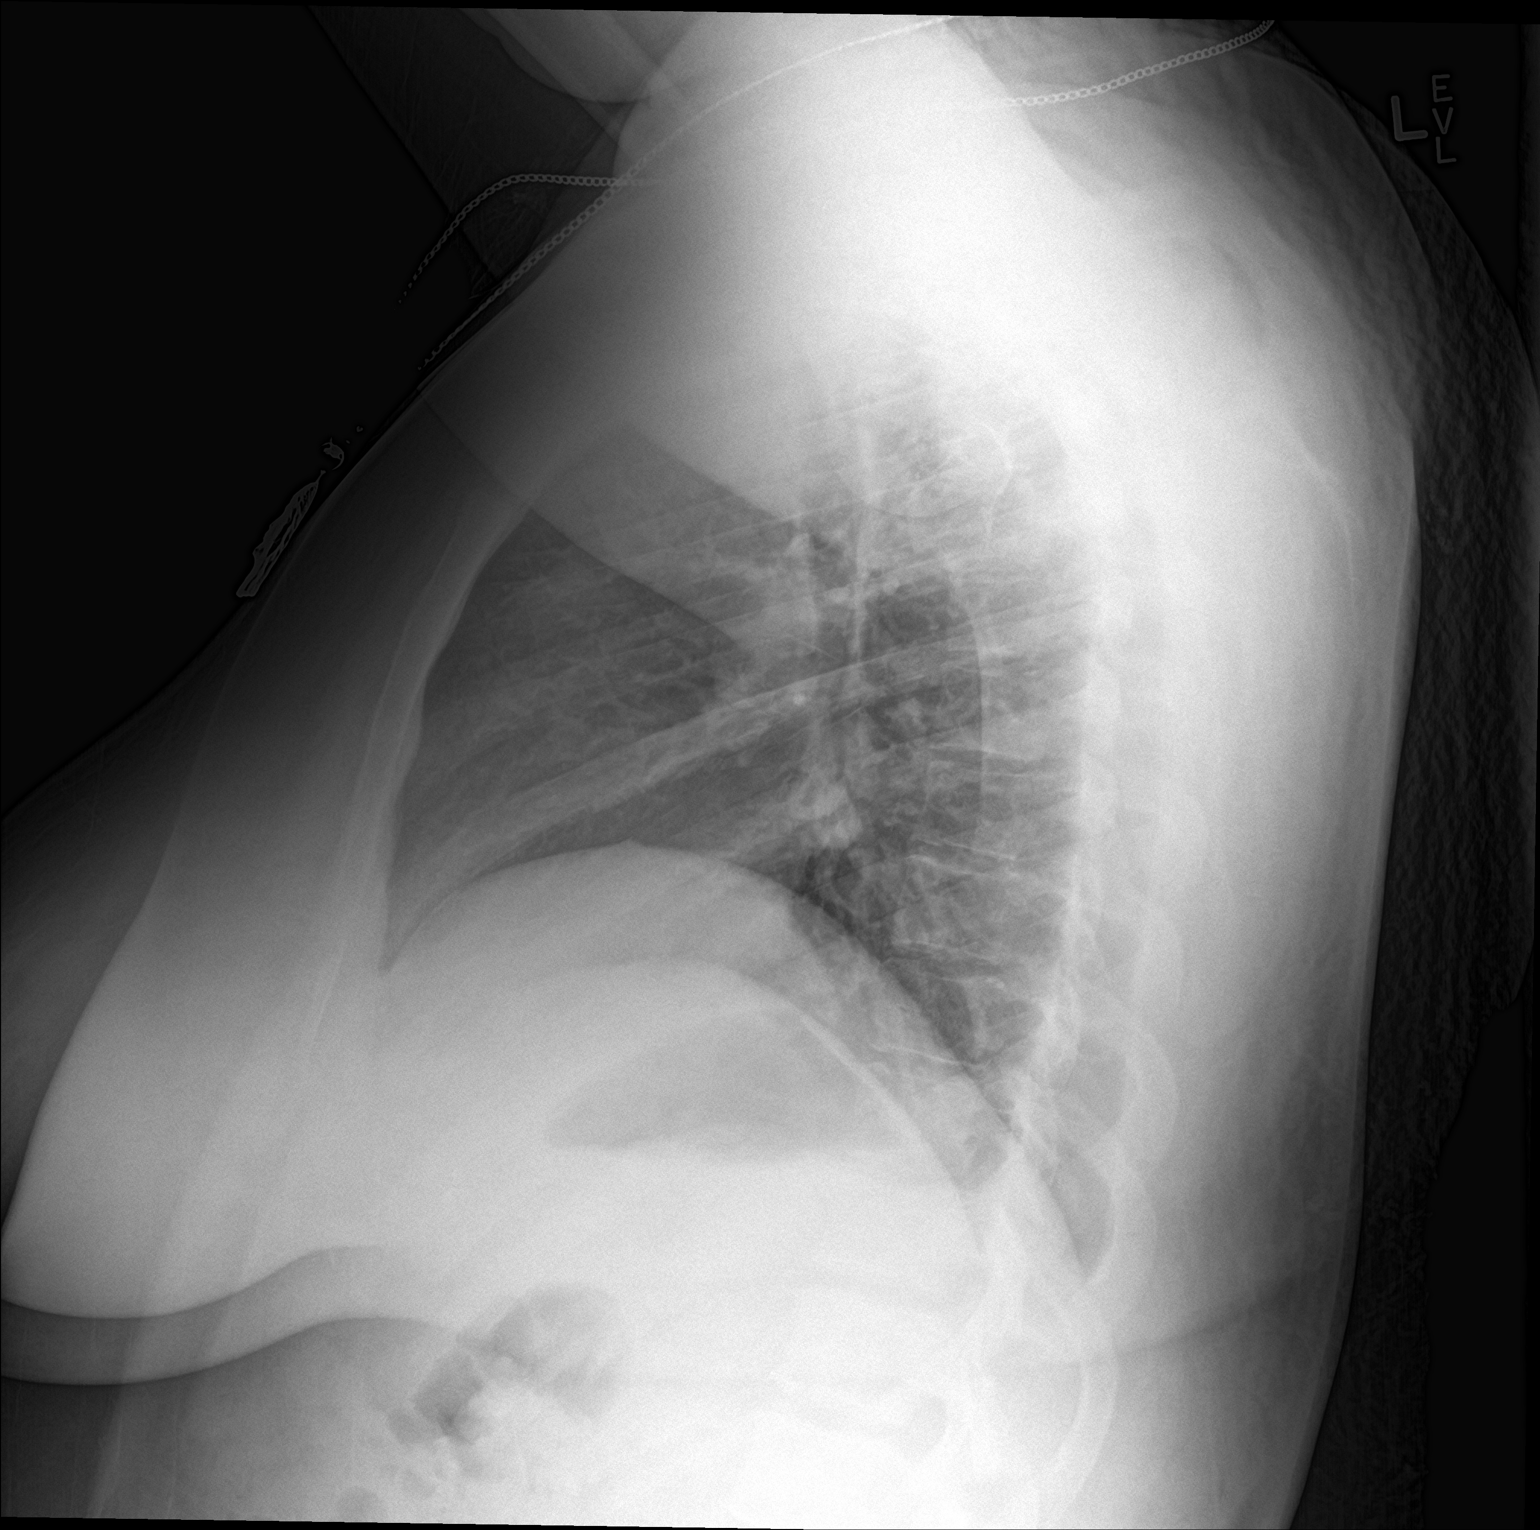

[2 of 2 positions shown; findings below may reference images not displayed]

FINDINGS: Cardiac shadow is within normal limits. The lungs are well aerated
bilaterally. No focal infiltrate or sizable effusion is noted. No
bony abnormality is seen.
IMPRESSION: No active cardiopulmonary disease.

## 2019-10-04 ENCOUNTER — Encounter (HOSPITAL_BASED_OUTPATIENT_CLINIC_OR_DEPARTMENT_OTHER): Payer: Self-pay | Admitting: *Deleted

## 2019-10-04 ENCOUNTER — Emergency Department (HOSPITAL_BASED_OUTPATIENT_CLINIC_OR_DEPARTMENT_OTHER)
Admission: EM | Admit: 2019-10-04 | Discharge: 2019-10-04 | Disposition: A | Discharge status: Home / Self Care | Payer: BC Managed Care – PPO | Attending: Emergency Medicine | Admitting: Emergency Medicine

## 2019-10-04 ENCOUNTER — Other Ambulatory Visit: Payer: Self-pay

## 2019-10-04 DIAGNOSIS — R519 Headache, unspecified: Secondary | ICD-10-CM | POA: Diagnosis present

## 2019-10-04 DIAGNOSIS — I10 Essential (primary) hypertension: Secondary | ICD-10-CM | POA: Insufficient documentation

## 2019-10-04 DIAGNOSIS — F17290 Nicotine dependence, other tobacco product, uncomplicated: Secondary | ICD-10-CM | POA: Diagnosis not present

## 2019-10-04 DIAGNOSIS — Z733 Stress, not elsewhere classified: Secondary | ICD-10-CM | POA: Diagnosis not present

## 2019-10-04 HISTORY — DX: Essential (primary) hypertension: I10

## 2019-10-04 LAB — PREGNANCY, URINE: Preg Test, Ur: NEGATIVE

## 2019-10-04 MED ORDER — KETOROLAC TROMETHAMINE 30 MG/ML IJ SOLN
30.0000 mg | Freq: Once | INTRAMUSCULAR | Status: AC
  Administered 2019-10-04: 16:00:00 30 mg via INTRAMUSCULAR
  Filled 2019-10-04: qty 1

## 2019-10-04 MED ORDER — ACETAMINOPHEN 500 MG PO TABS
1000.0000 mg | ORAL_TABLET | Freq: Once | ORAL | Status: AC
  Administered 2019-10-04: 1000 mg via ORAL
  Filled 2019-10-04: qty 2

## 2019-10-04 NOTE — ED Triage Notes (Signed)
Pt reports recurrent headache x 1 month. States she also has had difficulty sleeping. States headache was 10/10 this morning when she woke up but is now 5/10

## 2019-10-04 NOTE — Discharge Instructions (Signed)

## 2019-10-04 NOTE — ED Provider Notes (Signed)
Emergency Department Provider Note   I have reviewed the triage vital signs and the nursing notes.   HISTORY  Chief Complaint Headache   HPI Jessica Murray is a 28 y.o. female with PMH of HTN presents to the ED for evaluation of near daily headaches over the past month.  The patient describes bitemporal headaches with no significant vision change.  She not experiencing weakness, numbness, neck pain, fevers.  She states that her vision has seemed poor but that this has been going on for longer than the past month.  Her headaches tend to be worse in the morning.  No positional changes.  No history of similar headaches.  She does tell me that she has had increased stress with being out of work and is not sleeping well.  She is tried Benadryl, melatonin, NyQuil with no lasting sleep relief.     Past Medical History:  Diagnosis Date  . Hypertension     There are no problems to display for this patient.   History reviewed. No pertinent surgical history.  Allergies Patient has no known allergies.  No family history on file.  Social History Social History   Tobacco Use  . Smoking status: Current Every Day Smoker    Types: Cigars  . Smokeless tobacco: Never Used  Substance Use Topics  . Alcohol use: No  . Drug use: No    Review of Systems  Constitutional: No fever/chills Eyes: subacute vision changes.  ENT: No sore throat. Cardiovascular: Denies chest pain. Respiratory: Denies shortness of breath. Gastrointestinal: No abdominal pain.  No nausea, no vomiting.  No diarrhea.  No constipation. Genitourinary: Negative for dysuria. Musculoskeletal: Negative for back pain. Skin: Negative for rash. Neurological: Negative for focal weakness or numbness. Positive HA.   10-point ROS otherwise negative.  ____________________________________________   PHYSICAL EXAM:  VITAL SIGNS: ED Triage Vitals [10/04/19 1415]  Enc Vitals Group     BP (!) 136/91     Pulse Rate 94       Resp 18     Temp 99.1 F (37.3 C)     Temp Source Oral     SpO2 98 %     Weight 253 lb (114.8 kg)     Height 5\' 11"  (1.803 m)   Constitutional: Alert and oriented. Well appearing and in no acute distress. Eyes: Conjunctivae are normal. PERRL. Head: Atraumatic. Nose: No congestion/rhinnorhea. Mouth/Throat: Mucous membranes are moist. Neck: No stridor.  No meningeal signs.   Cardiovascular: Normal rate, regular rhythm. Good peripheral circulation. Grossly normal heart sounds.   Respiratory: Normal respiratory effort.  No retractions. Lungs CTAB. Gastrointestinal:  No distention.  Musculoskeletal: No gross deformities of extremities. Neurologic:  Normal speech and language. No gross focal neurologic deficits are appreciated. Steady gait.  Skin:  Skin is warm, dry and intact. No rash noted.   ____________________________________________   LABS (all labs ordered are listed, but only abnormal results are displayed)  Labs Reviewed  PREGNANCY, URINE   ____________________________________________   PROCEDURES  Procedure(s) performed:   Procedures  None  ____________________________________________   INITIAL IMPRESSION / ASSESSMENT AND PLAN / ED COURSE  Pertinent labs & imaging results that were available during my care of the patient were reviewed by me and considered in my medical decision making (see chart for details).   Patient presents to the emergency department for evaluation of daily headache over the past month.  She does not have focal neurologic deficits including evaluation of gait here in the emergency  department.  I do not see indication for CT imaging of the head.  MRI is not available at this facility but I do not feel she requires emergency MRI/transfer for this at this point.  Headache seems consistent with tension headache.  No concern for infectious etiology.  Pregnancy test is negative.  Patient given Tylenol and Toradol here in the emergency  department.  I have given contact information for both her PCP as well as local neurology group and encouraged her to call tomorrow to schedule follow-up appointments with both.   ____________________________________________  FINAL CLINICAL IMPRESSION(S) / ED DIAGNOSES  Final diagnoses:  Acute nonintractable headache, unspecified headache type     MEDICATIONS GIVEN DURING THIS VISIT:  Medications  ketorolac (TORADOL) 30 MG/ML injection 30 mg (has no administration in time range)  acetaminophen (TYLENOL) tablet 1,000 mg (1,000 mg Oral Given 10/04/19 1530)    Note:  This document was prepared using Dragon voice recognition software and may include unintentional dictation errors.  Nanda Quinton, MD, [facility] Emergency Medicine    Katoria Yetman, Wonda Olds, MD 10/04/19 [phone]

## 2021-02-08 ENCOUNTER — Encounter (HOSPITAL_BASED_OUTPATIENT_CLINIC_OR_DEPARTMENT_OTHER): Payer: Self-pay

## 2021-02-08 ENCOUNTER — Other Ambulatory Visit: Payer: Self-pay

## 2021-02-08 ENCOUNTER — Emergency Department (HOSPITAL_BASED_OUTPATIENT_CLINIC_OR_DEPARTMENT_OTHER)
Admission: EM | Admit: 2021-02-08 | Discharge: 2021-02-08 | Disposition: A | Discharge status: Home / Self Care | Payer: BC Managed Care – PPO | Attending: Emergency Medicine | Admitting: Emergency Medicine

## 2021-02-08 DIAGNOSIS — Z87891 Personal history of nicotine dependence: Secondary | ICD-10-CM | POA: Insufficient documentation

## 2021-02-08 DIAGNOSIS — I10 Essential (primary) hypertension: Secondary | ICD-10-CM | POA: Diagnosis not present

## 2021-02-08 DIAGNOSIS — K6289 Other specified diseases of anus and rectum: Secondary | ICD-10-CM | POA: Diagnosis present

## 2021-02-08 NOTE — ED Notes (Addendum)
Boil between the vagina and rectum C/o boil for two months Pt in a gown and has call bell

## 2021-02-08 NOTE — Discharge Instructions (Signed)
  You were evaluated in the Emergency Department and after careful evaluation, we did not find any emergent condition requiring admission or further testing in the hospital.   Your exam/testing today was overall reassuring.  Symptoms seem to be due to  cyst, if you want this removed you will need to schedule appoint with general surgeon.  Please come back to the emergency department if this area becomes irritated, red, you start developing fevers or starts to to drain. Please return to the Emergency Department if you experience any worsening of your condition.  Thank you for allowing [place] to be a part of your care. Please speak to your pharmacist about any new medications prescribed today in regards to side effects or interactions with other medications.

## 2021-02-08 NOTE — ED Triage Notes (Signed)
Pt c/o abscess to left buttock x 2 months-NAD-steady gait

## 2021-02-08 NOTE — ED Provider Notes (Signed)
[facility] EMERGENCY DEPARTMENT Provider Note   CSN: [id number] Arrival date & time: 02/08/21  1149     History Chief Complaint  Patient presents with  . Abscess    Jessica Murray is a 30 y.o. female with pertinent past medical history of hypertension the presents the emergency department today for perianal bump.  Patient states that she has had this bump on the left side of her buttocks for over 2 months.  States that it increases and then decreases in size.  States that there is no drainage coming from it, is not tender to palpation, states that it is just embarrassing and she wants it removed.  Denies any vaginal discharge, chance of STDs, rectal pain or rectal discharge.  Denies any hematochezia.  Denies any fevers, nausea or vomiting.  States that she is never had anything like this before.  States that the area does not bother her, however states that cosmetically it does.  HPI     Past Medical History:  Diagnosis Date  . Hypertension     There are no problems to display for this patient.   Past Surgical History:  Procedure Laterality Date  . LEEP       OB History   No obstetric history on file.     No family history on file.  Social History   Tobacco Use  . Smoking status: Former Games developer  . Smokeless tobacco: Never Used  Vaping Use  . Vaping Use: Never used  Substance Use Topics  . Alcohol use: No  . Drug use: No    Home Medications Prior to Admission medications   Medication Sig Start Date End Date Taking? Authorizing Provider  medroxyPROGESTERone (PROVERA) 10 MG tablet Take by mouth. 03/18/18   [provider]  predniSONE (DELTASONE) 20 MG tablet Take 2 tablets (40 mg total) by mouth daily with breakfast. For the next four days 02/28/18   Gerhard Munch, MD    Allergies    Patient has no known allergies.  Review of Systems   Review of Systems  Constitutional: Negative for diaphoresis, fatigue and fever.  Eyes: Negative for  visual disturbance.  Respiratory: Negative for shortness of breath.   Cardiovascular: Negative for chest pain.  Gastrointestinal: Negative for nausea and vomiting.  Musculoskeletal: Negative for back pain and myalgias.  Skin: Negative for color change, pallor, rash and wound.       Lesion.  Neurological: Negative for syncope, weakness, light-headedness, numbness and headaches.  Psychiatric/Behavioral: Negative for behavioral problems and confusion.    Physical Exam Updated Vital Signs BP 123/90 (BP Location: Left Arm)   Pulse 80   Temp 98.7 F (37.1 C) (Oral)   Resp 18   Ht 5\' 11"  (1.803 m)   Wt 107.5 kg   LMP 12/14/2020   SpO2 98%   BMI 33.05 kg/m   Physical Exam Constitutional:      General: She is not in acute distress.    Appearance: Normal appearance. She is not ill-appearing, toxic-appearing or diaphoretic.  Cardiovascular:     Rate and Rhythm: Normal rate and regular rhythm.     Pulses: Normal pulses.  Pulmonary:     Effort: Pulmonary effort is normal.     Breath sounds: Normal breath sounds.  Genitourinary:    Labia:        Right: No rash.        Left: No rash.        Comments: About 1 cm in diameter circular raised  bump noted to the left perianal region as depicted in picture.  Area is firm and soft, nontender to palpation.  Not warm or erythematous.  No surrounding cellulitis.  No drainage noted. Musculoskeletal:        General: Normal range of motion.  Lymphadenopathy:     Lower Body: No right inguinal adenopathy. No left inguinal adenopathy.  Skin:    General: Skin is warm and dry.     Capillary Refill: Capillary refill takes less than 2 seconds.  Neurological:     General: No focal deficit present.     Mental Status: She is alert and oriented to person, place, and time.  Psychiatric:        Mood and Affect: Mood normal.        Behavior: Behavior normal.        Thought Content: Thought content normal.     ED Results / Procedures / Treatments    Labs (all labs ordered are listed, but only abnormal results are displayed) Labs Reviewed - No data to display  EKG None  Radiology No results found.  Procedures Procedures   Medications Ordered in ED Medications - No data to display  ED Course  I have reviewed the triage vital signs and the nursing notes.  Pertinent labs & imaging results that were available during my care of the patient were reviewed by me and considered in my medical decision making (see chart for details).    MDM Rules/Calculators/A&P                         Patient presents to the emergency department today for bump in the perianal region, area without any warmth or surrounding cellulitis.  Does not appear as if this is an abscess, no areas of fluctuance.  Ultrasound does not show any area or pocket of fluid.Patient without any fevers, night sweats, weight changes, no lymphadenopathy.  Area is nontender, does not appear like a genital lesion/STD.  Does not appear like cellulitis.  Not a pilonidal cyst.  Appears results of cyst, will refer to general surgery for excision.  Strict precautions given.  Doubt need for further emergent work up at this time. I explained the diagnosis and have given explicit precautions to return to the ER including for any other new or worsening symptoms. The patient understands and accepts the medical plan as it's been dictated and I have answered their questions. Discharge instructions concerning home care and prescriptions have been given. The patient is STABLE and is discharged to home in good condition.  Final Clinical Impression(s) / ED Diagnoses Final diagnoses:  Cyst of perianal area    Rx / DC Orders ED Discharge Orders    None       Farrel Gordon, PA-C 02/08/21 1336    Alvira Monday, MD 02/09/21 [phone]

## 2021-02-26 ENCOUNTER — Emergency Department (HOSPITAL_BASED_OUTPATIENT_CLINIC_OR_DEPARTMENT_OTHER)
Admission: EM | Admit: 2021-02-26 | Discharge: 2021-02-26 | Disposition: A | Discharge status: Home / Self Care | Payer: BC Managed Care – PPO | Attending: Emergency Medicine | Admitting: Emergency Medicine

## 2021-02-26 ENCOUNTER — Other Ambulatory Visit: Payer: Self-pay

## 2021-02-26 ENCOUNTER — Encounter (HOSPITAL_BASED_OUTPATIENT_CLINIC_OR_DEPARTMENT_OTHER): Payer: Self-pay

## 2021-02-26 DIAGNOSIS — S81852A Open bite, left lower leg, initial encounter: Secondary | ICD-10-CM | POA: Diagnosis not present

## 2021-02-26 DIAGNOSIS — I10 Essential (primary) hypertension: Secondary | ICD-10-CM | POA: Diagnosis not present

## 2021-02-26 DIAGNOSIS — Z87891 Personal history of nicotine dependence: Secondary | ICD-10-CM | POA: Insufficient documentation

## 2021-02-26 DIAGNOSIS — W57XXXA Bitten or stung by nonvenomous insect and other nonvenomous arthropods, initial encounter: Secondary | ICD-10-CM | POA: Diagnosis not present

## 2021-02-26 DIAGNOSIS — S8992XA Unspecified injury of left lower leg, initial encounter: Secondary | ICD-10-CM | POA: Diagnosis present

## 2021-02-26 MED ORDER — HYDROCORTISONE 1 % EX CREA: TOPICAL_CREAM | CUTANEOUS | 0 refills | Status: AC

## 2021-02-26 NOTE — ED Triage Notes (Signed)
Pt states noticed rashy areas bilat lower extremities, torso since last night.  Itchy, red areas.  Took aveeno bath last night, helped with itching.

## 2021-02-26 NOTE — ED Provider Notes (Signed)
[facility] EMERGENCY DEPARTMENT Provider Note   CSN: [id number] Arrival date & time: 02/26/21  1048     History Chief Complaint  Patient presents with  . Rash    Jessica Murray is a 30 y.o. female presenting for evaluation of rash.  Patient states yesterday she was outside in the park for several hours.  When she got home last night, she noticed some itchy areas mostly on her left leg.  She also has some spots of her left side and left upper arm.  This morning, she noticed some itching around her right ankle.  She is concerned this may be bedbugs.  She is also concerned for urticaria.  She denies new lotions, foods, detergents, soaps, medications, or close.  She denies new environments.  No one else in the home has similar rash.  She denies fevers.  She denies recent tick bite. Lesions are itchy, not painful. Nothing makes them better or worse.   HPI     Past Medical History:  Diagnosis Date  . Hypertension     There are no problems to display for this patient.   Past Surgical History:  Procedure Laterality Date  . LEEP       OB History   No obstetric history on file.     History reviewed. No pertinent family history.  Social History   Tobacco Use  . Smoking status: Former Games developer  . Smokeless tobacco: Never Used  Vaping Use  . Vaping Use: Never used  Substance Use Topics  . Alcohol use: No  . Drug use: No    Home Medications Prior to Admission medications   Medication Sig Start Date End Date Taking? Authorizing Provider  hydrocortisone cream 1 % Apply to affected area 2 times daily 02/26/21  Yes Ryleigh Esqueda, PA-C  medroxyPROGESTERone (PROVERA) 10 MG tablet Take by mouth. 03/18/18   [provider]  predniSONE (DELTASONE) 20 MG tablet Take 2 tablets (40 mg total) by mouth daily with breakfast. For the next four days 02/28/18   Gerhard Munch, MD    Allergies    Patient has no known allergies.  Review of Systems   Review of  Systems  Constitutional: Negative for fever.  Skin: Positive for rash.    Physical Exam Updated Vital Signs BP 132/78   Pulse 75   Temp 98.4 F (36.9 C) (Oral)   Resp 16   Ht 5\' 11"  (1.803 m)   Wt 62.6 kg   SpO2 100%   BMI 19.25 kg/m   Physical Exam Vitals and nursing note reviewed.  Constitutional:      General: She is not in acute distress.    Appearance: She is well-developed.  HENT:     Head: Normocephalic and atraumatic.  Pulmonary:     Effort: Pulmonary effort is normal.  Abdominal:     General: There is no distension.  Musculoskeletal:        General: Normal range of motion.     Cervical back: Normal range of motion.  Skin:    General: Skin is warm.     Findings: No rash.     Comments: Many discrete lesions with surrounding inflammation.  Mostly of the lateral left leg, lateral left low abdomen, left upper arm, and right ankle.  No lesions on the palms or soles.  Mucous membranes without sloughing.  No blisters or bulla.  No lesions on the trunk or upper thighs, where clothes would prevent insect bites  Neurological:  Mental Status: She is alert and oriented to person, place, and time.     ED Results / Procedures / Treatments   Labs (all labs ordered are listed, but only abnormal results are displayed) Labs Reviewed - No data to display  EKG None  Radiology No results found.  Procedures Procedures   Medications Ordered in ED Medications - No data to display  ED Course  I have reviewed the triage vital signs and the nursing notes.  Pertinent labs & imaging results that were available during my care of the patient were reviewed by me and considered in my medical decision making (see chart for details).    MDM Rules/Calculators/A&P                          Patient resenting for evaluation of rash.  On exam, patient appears nontoxic.  Exam is not consistent with SJS, TN, RMSF, syphilis.  Rash is most consistent with insect bite, likely mosquito  versus trigger.  Less likely likely bedbugs.  Discussed with patient.  Discussed symptomatic treatment with hydrocortisone cream and time.  At this time, patient appears safe for discharge.  Return precautions given.  Patient states she understands and agrees to plan.  Final Clinical Impression(s) / ED Diagnoses Final diagnoses:  Bug bite, initial encounter    Rx / DC Orders ED Discharge Orders         Ordered    hydrocortisone cream 1 %        02/26/21 1128           Loreta Blouch, PA-C 02/26/21 1136    Terrilee Files, MD 02/26/21 1754

## 2021-02-26 NOTE — Discharge Instructions (Signed)
Use the steroid cream to help with itching and swelling.  You may also take benadryl or other allergy medicine as needed, but these may make you tired.  Use ice/cold on the spots instead of itching.  Try not to scratch at the area, as this can increase risk for infection. Return to emergency room if you develop high fevers, difficulty breathing, rash of your palms or soles, or any new, sudden, or concerning symptoms.

## 2021-04-03 ENCOUNTER — Encounter (HOSPITAL_BASED_OUTPATIENT_CLINIC_OR_DEPARTMENT_OTHER): Payer: Self-pay | Admitting: Emergency Medicine

## 2021-04-03 ENCOUNTER — Emergency Department (HOSPITAL_BASED_OUTPATIENT_CLINIC_OR_DEPARTMENT_OTHER)
Admission: EM | Admit: 2021-04-03 | Discharge: 2021-04-03 | Disposition: A | Discharge status: Home / Self Care | Payer: BC Managed Care – PPO | Attending: Emergency Medicine | Admitting: Emergency Medicine

## 2021-04-03 ENCOUNTER — Other Ambulatory Visit: Payer: Self-pay

## 2021-04-03 DIAGNOSIS — N760 Acute vaginitis: Secondary | ICD-10-CM | POA: Diagnosis not present

## 2021-04-03 DIAGNOSIS — Z87891 Personal history of nicotine dependence: Secondary | ICD-10-CM | POA: Insufficient documentation

## 2021-04-03 DIAGNOSIS — Z711 Person with feared health complaint in whom no diagnosis is made: Secondary | ICD-10-CM

## 2021-04-03 DIAGNOSIS — N898 Other specified noninflammatory disorders of vagina: Secondary | ICD-10-CM | POA: Diagnosis present

## 2021-04-03 DIAGNOSIS — I10 Essential (primary) hypertension: Secondary | ICD-10-CM | POA: Diagnosis not present

## 2021-04-03 DIAGNOSIS — B9689 Other specified bacterial agents as the cause of diseases classified elsewhere: Secondary | ICD-10-CM

## 2021-04-03 LAB — URINALYSIS, ROUTINE W REFLEX MICROSCOPIC
Bilirubin Urine: NEGATIVE
Glucose, UA: NEGATIVE mg/dL
Hgb urine dipstick: NEGATIVE
Ketones, ur: NEGATIVE mg/dL
Leukocytes,Ua: NEGATIVE
Nitrite: NEGATIVE
Protein, ur: NEGATIVE mg/dL
Specific Gravity, Urine: >1.03 — ABNORMAL HIGH (ref 1.005–1.030)
pH: 5 (ref 5.0–8.0)

## 2021-04-03 LAB — WET PREP, GENITAL
Sperm: NONE SEEN
Trich, Wet Prep: NONE SEEN
Yeast Wet Prep HPF POC: NONE SEEN

## 2021-04-03 LAB — PREGNANCY, URINE: Preg Test, Ur: NEGATIVE

## 2021-04-03 MED ORDER — CEFTRIAXONE SODIUM 500 MG IJ SOLR
500.0000 mg | Freq: Once | INTRAMUSCULAR | Status: AC
  Administered 2021-04-03: 500 mg via INTRAMUSCULAR
  Filled 2021-04-03: qty 500

## 2021-04-03 MED ORDER — LIDOCAINE HCL (PF) 1 % IJ SOLN
INTRAMUSCULAR | Status: AC
  Administered 2021-04-03: 1 mL
  Filled 2021-04-03: qty 5

## 2021-04-03 MED ORDER — METRONIDAZOLE 500 MG PO TABS: 500.0000 mg | ORAL_TABLET | Freq: Two times a day (BID) | ORAL | 0 refills | Status: AC

## 2021-04-03 MED ORDER — DOXYCYCLINE HYCLATE 100 MG PO CAPS: 100.0000 mg | ORAL_CAPSULE | Freq: Two times a day (BID) | ORAL | 0 refills | Status: AC

## 2021-04-03 NOTE — ED Provider Notes (Signed)
[facility] EMERGENCY DEPARTMENT Provider Note   CSN: [id number] Arrival date & time: 04/03/21  1442     History Chief Complaint  Patient presents with   Vaginal Itching    Jessica Murray is a 30 y.o. female.  HPI  Patient is a 30 year old female with a history of hypertension as well as chlamydia who presents to the emergency department due to vaginal irritation.  She states her symptoms started about 1 week ago.  Reports irritation with itching/burning pain along the labia.  Denies any vaginal discharge, dysuria, or lower abdominal pain.  States her symptoms are consistent with prior chlamydia diagnosis and April of this year.  She states that she is sexually active with 1 female partner and that they use condoms regularly.     Past Medical History:  Diagnosis Date   Hypertension     There are no problems to display for this patient.   Past Surgical History:  Procedure Laterality Date   LEEP       OB History   No obstetric history on file.     History reviewed. No pertinent family history.  Social History   Tobacco Use   Smoking status: Former    Pack years: 0.00   Smokeless tobacco: Never  Vaping Use   Vaping Use: Never used  Substance Use Topics   Alcohol use: No   Drug use: No    Home Medications Prior to Admission medications   Medication Sig Start Date End Date Taking? Authorizing Provider  doxycycline (VIBRAMYCIN) 100 MG capsule Take 1 capsule (100 mg total) by mouth 2 (two) times daily for 7 days. 04/03/21 04/10/21 Yes Placido Sou, PA-C  metroNIDAZOLE (FLAGYL) 500 MG tablet Take 1 tablet (500 mg total) by mouth 2 (two) times daily for 7 days. 04/03/21 04/10/21 Yes Placido Sou, PA-C  hydrocortisone cream 1 % Apply to affected area 2 times daily 02/26/21   Caccavale, Sophia, PA-C  medroxyPROGESTERone (PROVERA) 10 MG tablet Take by mouth. 03/18/18   [provider]  predniSONE (DELTASONE) 20 MG tablet Take 2 tablets (40 mg total)  by mouth daily with breakfast. For the next four days 02/28/18   Gerhard Munch, MD    Allergies    Patient has no known allergies.  Review of Systems   Review of Systems  All other systems reviewed and are negative. Ten systems reviewed and are negative for acute change, except as noted in the HPI.   Physical Exam Updated Vital Signs BP 124/88 (BP Location: Right Arm)   Pulse 79   Temp 98.2 F (36.8 C) (Oral)   Resp 18   Ht 5\' 10"  (1.778 m)   Wt 106.6 kg   LMP 12/06/2020 (Within Days)   SpO2 99%   BMI 33.72 kg/m   Physical Exam Vitals and nursing note reviewed.  Constitutional:      General: She is not in acute distress.    Appearance: She is well-developed.  HENT:     Head: Normocephalic and atraumatic.     Right Ear: External ear normal.     Left Ear: External ear normal.  Eyes:     General: No scleral icterus.       Right eye: No discharge.        Left eye: No discharge.     Conjunctiva/sclera: Conjunctivae normal.  Neck:     Trachea: No tracheal deviation.  Cardiovascular:     Rate and Rhythm: Normal rate.  Pulmonary:  Effort: Pulmonary effort is normal. No respiratory distress.     Breath sounds: No stridor.  Abdominal:     General: Abdomen is flat. There is no distension.     Palpations: Abdomen is soft.     Tenderness: There is no abdominal tenderness.  Genitourinary:    Comments: Female nursing chaperone present.  Normal-appearing vulvar region.  No lesions or rashes.  No erythema.  Normal-appearing vaginal mucosa.  Closed cervical os with no signs of erythema.  Small amount of yellow discharge noted in the vaginal vault.  No cervical motion tenderness. Musculoskeletal:        General: No swelling or deformity.     Cervical back: Neck supple.  Skin:    General: Skin is warm and dry.     Findings: No rash.  Neurological:     Mental Status: She is alert.     Cranial Nerves: Cranial nerve deficit: no gross deficits.   ED Results / Procedures /  Treatments   Labs (all labs ordered are listed, but only abnormal results are displayed) Labs Reviewed  WET PREP, GENITAL - Abnormal; Notable for the following components:      Result Value   Clue Cells Wet Prep HPF POC PRESENT (*)    WBC, Wet Prep HPF POC MANY (*)    All other components within normal limits  URINALYSIS, ROUTINE W REFLEX MICROSCOPIC - Abnormal; Notable for the following components:   Specific Gravity, Urine >1.030 (*)    All other components within normal limits  PREGNANCY, URINE  GC/CHLAMYDIA PROBE AMP ([place]) NOT AT [facility]    EKG None  Radiology No results found.  Procedures Procedures   Medications Ordered in ED Medications  cefTRIAXone (ROCEPHIN) injection 500 mg (has no administration in time range)    ED Course  I have reviewed the triage vital signs and the nursing notes.  Pertinent labs & imaging results that were available during my care of the patient were reviewed by me and considered in my medical decision making (see chart for details).  Clinical Course as of 04/03/21 1714  Mon Apr 03, 2021  1645 Clue Cells Wet Prep HPF POC(!): PRESENT [LJ]  1645 WBC, Wet Prep HPF POC(!): MANY [LJ]    Clinical Course User Index [LJ] [name]   MDM Rules/Calculators/A&P                          Patient is a 30 year old female who presents to the emergency department due to vaginal irritation.  Physical exam consistent for a small amount of discharge in the vaginal vault.  No cervical motion tenderness or increased friability.  Patient states her symptoms today are consistent with prior chlamydial symptoms a few months ago.  She states she is only sexually active with 1 female partner and use condoms persistently.  Wet prep is positive for clue cells as well as many white blood cells.  UA negative.  Pregnancy test negative.  Will treat for BV with Flagyl.  Patient states that her symptoms are consistent with prior chlamydial infection and  she would like to be treated empirically for GC/chlamydia.  We will do so.  Patient given IM Rocephin in the ED as well as a 1 week course of doxycycline.  Discussed safe sex practices with patient.  She knows to refrain from sex for the next 1 to 2 weeks until her symptoms completely resolved.  Follow-up for retesting at the health  department.  Her questions were answered and she was amicable at the time of discharge.  Final Clinical Impression(s) / ED Diagnoses Final diagnoses:  BV (bacterial vaginosis)  Concern about STD in female without diagnosis   Rx / DC Orders ED Discharge Orders          Ordered    doxycycline (VIBRAMYCIN) 100 MG capsule  2 times daily        04/03/21 1709    metroNIDAZOLE (FLAGYL) 500 MG tablet  2 times daily        04/03/21 1709             Placido Sou, PA-C 04/03/21 1714    Melene Plan, DO 04/03/21 2148

## 2021-04-03 NOTE — Discharge Instructions (Addendum)
I prescribed you 2 antibiotics.  The first is called metronidazole.  The second is called doxycycline.  These will treat you for both bacterial vaginosis (which you were diagnosed with today) as well as a possible chlamydia infection.  Please take these twice a day for the next 7 days.  Do not stop taking these early.  Please refrain from sexual intercourse for the next 1 to 2 weeks until your symptoms completely resolve.  Please follow-up with the health department for retesting.  If you develop any new or worsening symptoms please come back to the emergency department.  It was a pleasure to meet you.

## 2021-04-03 NOTE — ED Triage Notes (Signed)
Pt presents to ED Pov. Pt c/o vaginal burning/stinging since last week. Pt reports having chlamydia a few months ago and this feels the same. Denies having unprotected sex.

## 2021-04-04 LAB — GC/CHLAMYDIA PROBE AMP (~~LOC~~) NOT AT ARMC
Chlamydia: NEGATIVE
Comment: NEGATIVE
Comment: NORMAL
Neisseria Gonorrhea: NEGATIVE

## 2021-10-04 ENCOUNTER — Other Ambulatory Visit: Payer: Self-pay

## 2021-10-04 ENCOUNTER — Encounter (HOSPITAL_BASED_OUTPATIENT_CLINIC_OR_DEPARTMENT_OTHER): Payer: Self-pay | Admitting: *Deleted

## 2021-10-04 ENCOUNTER — Emergency Department (HOSPITAL_BASED_OUTPATIENT_CLINIC_OR_DEPARTMENT_OTHER)
Admission: EM | Admit: 2021-10-04 | Discharge: 2021-10-04 | Disposition: A | Discharge status: Home / Self Care | Payer: BC Managed Care – PPO | Attending: Emergency Medicine | Admitting: Emergency Medicine

## 2021-10-04 DIAGNOSIS — R509 Fever, unspecified: Secondary | ICD-10-CM | POA: Diagnosis present

## 2021-10-04 DIAGNOSIS — I10 Essential (primary) hypertension: Secondary | ICD-10-CM | POA: Insufficient documentation

## 2021-10-04 DIAGNOSIS — U071 COVID-19: Secondary | ICD-10-CM | POA: Insufficient documentation

## 2021-10-04 DIAGNOSIS — Z87891 Personal history of nicotine dependence: Secondary | ICD-10-CM | POA: Insufficient documentation

## 2021-10-04 LAB — RESP PANEL BY RT-PCR (FLU A&B, COVID) ARPGX2
Influenza A by PCR: NEGATIVE
Influenza B by PCR: NEGATIVE
SARS Coronavirus 2 by RT PCR: POSITIVE — AB

## 2021-10-04 MED ORDER — ACETAMINOPHEN 325 MG PO TABS
650.0000 mg | ORAL_TABLET | Freq: Once | ORAL | Status: AC
  Administered 2021-10-04: 18:00:00 650 mg via ORAL
  Filled 2021-10-04: qty 2

## 2021-10-04 MED ORDER — BENZONATATE 200 MG PO CAPS: 200.0000 mg | ORAL_CAPSULE | Freq: Three times a day (TID) | ORAL | 0 refills | Status: AC | PRN

## 2021-10-04 NOTE — Discharge Instructions (Addendum)
You have tested positive for COVID-19 virus.  Please continue to quarantine at home and monitor your symptoms closely. Antibiotics are not helpful in treating viral infection, the virus should run its course in about 7-10 days. Please make sure you are drinking plenty of fluids. You can treat your symptoms supportively with tylenol and motrin for fevers and pains, and prescribed cough medications and throat lozenges to help with cough. You can use over the counter flonase and zyrtec for congestion. If your symptoms are not improving please follow up with you Primary doctor.   I recommend that you purchase a home pulse ox to help better monitor your oxygen at home, if you start to have increased work of breathing or shortness of breath or your oxygen drops below 90% please immediately return to the hospital for reevaluation.  If you develop persistent fevers, shortness of breath or difficulty breathing, chest pain, severe headache and neck pain, persistent nausea and vomiting or other new or concerning symptoms return to the Emergency department.

## 2021-10-04 NOTE — ED Provider Notes (Signed)
[facility] EMERGENCY DEPARTMENT Provider Note   CSN: [id number] Arrival date & time: 10/04/21  1809     History Chief Complaint  Patient presents with   Fever    Jessica Murray is a 30 y.o. female.  Jessica Murray is a 31 y.o. female with a history of hypertension, who presents to the ED for evaluation of fever, cough, headache, body aches.  Symptoms have been present for 2.  Patient reports that have been constant and worsening.  Cough is intermittently productive and associated with nasal congestion.  Denies any chest pain or shortness of breath.  No abdominal pain, nausea, vomiting or diarrhea.  She denies any known sick contacts.  She has had 2 COVID vaccines but no boosters.  No meds prior to arrival.  No other aggravating or alleviating factors.  The history is provided by the patient and medical records.  Fever Associated symptoms: chills, congestion, cough, headaches, myalgias, rhinorrhea and sore throat   Associated symptoms: no chest pain, no diarrhea, no nausea, no rash and no vomiting       Past Medical History:  Diagnosis Date   Hypertension     There are no problems to display for this patient.   Past Surgical History:  Procedure Laterality Date   LEEP     LEEP       OB History   No obstetric history on file.     No family history on file.  Social History   Tobacco Use   Smoking status: Former   Smokeless tobacco: Never  Building services engineer Use: Never used  Substance Use Topics   Alcohol use: No   Drug use: No    Home Medications Prior to Admission medications   Medication Sig Start Date End Date Taking? Authorizing Provider  benzonatate (TESSALON) 200 MG capsule Take 1 capsule (200 mg total) by mouth 3 (three) times daily as needed for cough. 10/04/21  Yes Dartha Lodge, PA-C  hydrocortisone cream 1 % Apply to affected area 2 times daily 02/26/21   Caccavale, Sophia, PA-C  medroxyPROGESTERone (PROVERA) 10 MG tablet Take by  mouth. 03/18/18   [provider]  predniSONE (DELTASONE) 20 MG tablet Take 2 tablets (40 mg total) by mouth daily with breakfast. For the next four days 02/28/18   Gerhard Munch, MD    Allergies    Patient has no known allergies.  Review of Systems   Review of Systems  Constitutional:  Positive for chills and fever.  HENT:  Positive for congestion, rhinorrhea and sore throat.   Respiratory:  Positive for cough. Negative for shortness of breath.   Cardiovascular:  Negative for chest pain.  Gastrointestinal:  Negative for abdominal pain, diarrhea, nausea and vomiting.  Musculoskeletal:  Positive for myalgias. Negative for neck pain and neck stiffness.  Skin:  Negative for color change and rash.  Neurological:  Positive for headaches.  All other systems reviewed and are negative.  Physical Exam Updated Vital Signs BP (!) 140/97 (BP Location: Right Arm)    Pulse (!) 126    Temp (!) 100.5 F (38.1 C)    Resp 16    Ht 5\' 11"  (1.803 m)    Wt 120.2 kg    SpO2 96%    BMI 36.96 kg/m   Physical Exam Vitals and nursing note reviewed.  Constitutional:      General: She is not in acute distress.    Appearance: Normal appearance. She is well-developed. She is not  ill-appearing or diaphoretic.  HENT:     Head: Normocephalic and atraumatic.     Nose: Congestion and rhinorrhea present.     Mouth/Throat:     Mouth: Mucous membranes are moist.     Pharynx: Oropharynx is clear. Posterior oropharyngeal erythema present. No oropharyngeal exudate.     Comments: Posterior oropharynx clear and mucous membranes moist, there is mild erythema but no edema or tonsillar exudates, uvula midline, normal phonation, no trismus, tolerating secretions without difficulty. Eyes:     General:        Right eye: No discharge.        Left eye: No discharge.     Pupils: Pupils are equal, round, and reactive to light.  Cardiovascular:     Rate and Rhythm: Normal rate and regular rhythm.     Pulses: Normal  pulses.     Heart sounds: Normal heart sounds.  Pulmonary:     Effort: Pulmonary effort is normal. No respiratory distress.     Breath sounds: Normal breath sounds. No wheezing or rales.     Comments: Respirations equal and unlabored, patient able to speak in full sentences, lungs clear to auscultation bilaterally  Abdominal:     General: Bowel sounds are normal. There is no distension.     Palpations: Abdomen is soft. There is no mass.     Tenderness: There is no abdominal tenderness. There is no guarding.     Comments: Abdomen soft, nondistended, nontender to palpation in all quadrants without guarding or peritoneal signs  Musculoskeletal:        General: No deformity.     Cervical back: Neck supple. No rigidity.  Lymphadenopathy:     Cervical: No cervical adenopathy.  Skin:    General: Skin is warm and dry.     Capillary Refill: Capillary refill takes less than 2 seconds.  Neurological:     Mental Status: She is alert and oriented to person, place, and time.     Coordination: Coordination normal.     Comments: Speech is clear, able to follow commands Moves extremities without ataxia, coordination intact  Psychiatric:        Mood and Affect: Mood normal.        Behavior: Behavior normal.    ED Results / Procedures / Treatments   Labs (all labs ordered are listed, but only abnormal results are displayed) Labs Reviewed  RESP PANEL BY RT-PCR (FLU A&B, COVID) ARPGX2 - Abnormal; Notable for the following components:      Result Value   SARS Coronavirus 2 by RT PCR POSITIVE (*)    All other components within normal limits    EKG None  Radiology No results found.  Procedures Procedures   Medications Ordered in ED Medications  acetaminophen (TYLENOL) tablet 650 mg (650 mg Oral Given 10/04/21 1821)    ED Course  I have reviewed the triage vital signs and the nursing notes.  Pertinent labs & imaging results that were available during my care of the patient were  reviewed by me and considered in my medical decision making (see chart for details).    MDM Rules/Calculators/A&P                         31 y.o. female presents with 2 days of cough, congestion, fevers, bodyaches. Concerned for possible COVID, flu or other viral URI.  Unknown sick contacts. Patient has received COVID vaccines, but no boosters. Fortunately patient is overall well-appearing,  febrile and tachycardic but improving with medication,, patient with no hypoxia or increased work of breathing at rest or with activity.   Given reassuring O2 sats do not feel that chest x-ray is indicated at this time.  Patient's COVID test is positive, negative for influenza.  Patient diagnosed with COVID infection today but overall symptoms appear mild and evaluation has been very reassuring today. No criteria for admission at this time. Discussed appropriate quarantine at home as well as continued symptomatic treatment. Encourage patient to purchase pulse ox for monitoring of O2 sats at home and discussed strict return precaution. Provided information for post-COVID care clinic as well. Strict return precautions discussed. Patient expresses understanding and agreement. Discharged home in good condition.  Charyl Lookingbill was evaluated in Emergency Department on 10/04/2021 for the symptoms described in the history of present illness. She was evaluated in the context of the global COVID-19 pandemic, which necessitated consideration that the patient might be at risk for infection with the SARS-CoV-2 virus that causes COVID-19. Institutional protocols and algorithms that pertain to the evaluation of patients at risk for COVID-19 are in a state of rapid change based on information released by regulatory bodies including the CDC and federal and state organizations. These policies and algorithms were followed during the patient's care in the ED.    Final Clinical Impression(s) / ED Diagnoses Final diagnoses:   COVID-19 virus infection    Rx / DC Orders ED Discharge Orders          Ordered    benzonatate (TESSALON) 200 MG capsule  3 times daily PRN        10/04/21 2201             Dartha Lodge, PA-C 10/05/21 0014    Rozelle Logan, DO 10/05/21 1532

## 2021-10-04 NOTE — ED Triage Notes (Signed)
C/o fever cough , h/a , body aches x 2 days

## 2022-06-25 ENCOUNTER — Encounter (HOSPITAL_BASED_OUTPATIENT_CLINIC_OR_DEPARTMENT_OTHER): Payer: Self-pay | Admitting: Emergency Medicine

## 2022-06-25 ENCOUNTER — Emergency Department (HOSPITAL_BASED_OUTPATIENT_CLINIC_OR_DEPARTMENT_OTHER)
Admission: EM | Admit: 2022-06-25 | Discharge: 2022-06-26 | Disposition: A | Discharge status: Home / Self Care | Payer: BC Managed Care – PPO | Attending: Emergency Medicine | Admitting: Emergency Medicine

## 2022-06-25 ENCOUNTER — Other Ambulatory Visit: Payer: Self-pay

## 2022-06-25 DIAGNOSIS — R1084 Generalized abdominal pain: Secondary | ICD-10-CM

## 2022-06-25 LAB — COMPREHENSIVE METABOLIC PANEL
ALT: 16 U/L (ref 0–44)
AST: 23 U/L (ref 15–41)
Albumin: 3.6 g/dL (ref 3.5–5.0)
Alkaline Phosphatase: 83 U/L (ref 38–126)
Anion gap: 8 (ref 5–15)
BUN: 13 mg/dL (ref 6–20)
CO2: 23 mmol/L (ref 22–32)
Calcium: 8.4 mg/dL — ABNORMAL LOW (ref 8.9–10.3)
Chloride: 105 mmol/L (ref 98–111)
Creatinine, Ser: 0.74 mg/dL (ref 0.44–1.00)
GFR, Estimated: >60 mL/min (ref >60)
Glucose, Bld: 152 mg/dL — ABNORMAL HIGH (ref 70–99)
Potassium: 3.6 mmol/L (ref 3.5–5.1)
Sodium: 136 mmol/L (ref 135–145)
Total Bilirubin: 0.5 mg/dL (ref 0.3–1.2)
Total Protein: 6.7 g/dL (ref 6.5–8.1)

## 2022-06-25 LAB — CBC
HCT: 38.6 % (ref 36.0–46.0)
Hemoglobin: 12.7 g/dL (ref 12.0–15.0)
MCH: 28.6 pg (ref 26.0–34.0)
MCHC: 32.9 g/dL (ref 30.0–36.0)
MCV: 86.9 fL (ref 80.0–100.0)
Platelets: 358 10*3/uL (ref 150–400)
RBC: 4.44 MIL/uL (ref 3.87–5.11)
RDW: 13.6 % (ref 11.5–15.5)
WBC: 10.1 10*3/uL (ref 4.0–10.5)
nRBC: 0 % (ref 0.0–0.2)

## 2022-06-25 LAB — LIPASE, BLOOD: Lipase: 31 U/L (ref 11–51)

## 2022-06-25 NOTE — ED Triage Notes (Signed)
Patient arrived via POV c/o abdominal pain x 4 days. Patient states pain increasingly worse. Patient endorses nausea, denies vomiting. Patient states patient intermittent pain lasting approximately 1 min at 10/10. Patient is AO x 4, VS WDL, normal gait.

## 2022-06-26 ENCOUNTER — Emergency Department (HOSPITAL_BASED_OUTPATIENT_CLINIC_OR_DEPARTMENT_OTHER): Payer: BC Managed Care – PPO

## 2022-06-26 LAB — URINALYSIS, ROUTINE W REFLEX MICROSCOPIC
Bilirubin Urine: NEGATIVE
Glucose, UA: NEGATIVE mg/dL
Hgb urine dipstick: NEGATIVE
Ketones, ur: NEGATIVE mg/dL
Leukocytes,Ua: NEGATIVE
Nitrite: NEGATIVE
Protein, ur: NEGATIVE mg/dL
Specific Gravity, Urine: >=1.03 (ref 1.005–1.030)
pH: 5 (ref 5.0–8.0)

## 2022-06-26 LAB — PREGNANCY, URINE: Preg Test, Ur: NEGATIVE

## 2022-06-26 MED ORDER — TRAMADOL HCL 50 MG PO TABS: 50.0000 mg | ORAL_TABLET | Freq: Four times a day (QID) | ORAL | 0 refills | Status: DC | PRN

## 2022-06-26 NOTE — Discharge Instructions (Addendum)
Begin taking ibuprofen 600 mg every 6 hours as needed for pain.  Begin taking tramadol as prescribed as needed for pain not relieved with ibuprofen.  Follow-up with primary doctor if not improving in the next week, and return to the ER if you develop worsening pain, high fevers, bloody stools, or for other new and concerning symptoms.

## 2022-06-26 NOTE — ED Provider Notes (Signed)
[facility] EMERGENCY DEPARTMENT Provider Note   CSN: [id number] Arrival date & time: 06/25/22  2224     History  Chief Complaint  Patient presents with   Abdominal Pain    Jessica Murray is a 31 y.o. female.  Patient is a 31 year old female with no significant past medical history.  Patient presenting today with complaints of abdominal pain.  She describes a 4-day history of intermittent, crampy abdominal pain that has been increasing in frequency since.  She describes pain to the epigastric and right upper quadrant that occurs randomly throughout the day.  She reports multiple episodes this evening with pain levels as high as 10 out of 10.  She denies any nausea or vomiting.  She denies any diarrhea or constipation.  She denies fevers or chills.  Last menstrual period was 2 weeks ago.  The history is provided by the patient.       Home Medications Prior to Admission medications   Medication Sig Start Date End Date Taking? Authorizing Provider  benzonatate (TESSALON) 200 MG capsule Take 1 capsule (200 mg total) by mouth 3 (three) times daily as needed for cough. 10/04/21   Dartha Lodge, PA-C  hydrocortisone cream 1 % Apply to affected area 2 times daily 02/26/21   Caccavale, Sophia, PA-C  medroxyPROGESTERone (PROVERA) 10 MG tablet Take by mouth. 03/18/18   [provider]  predniSONE (DELTASONE) 20 MG tablet Take 2 tablets (40 mg total) by mouth daily with breakfast. For the next four days 02/28/18   Gerhard Munch, MD      Allergies    Patient has no known allergies.    Review of Systems   Review of Systems  All other systems reviewed and are negative.   Physical Exam Updated Vital Signs BP 122/71   Pulse 84   Temp 98.8 F (37.1 C) (Oral)   Resp 19   Ht 5\' 9"  (1.753 m)   Wt 103.4 kg   LMP 06/10/2022 (Exact Date)   SpO2 95%   BMI 33.67 kg/m  Physical Exam Vitals and nursing note reviewed.  Constitutional:      General: She is not in acute  distress.    Appearance: She is well-developed. She is not diaphoretic.  HENT:     Head: Normocephalic and atraumatic.  Cardiovascular:     Rate and Rhythm: Normal rate and regular rhythm.     Heart sounds: No murmur heard.    No friction rub. No gallop.  Pulmonary:     Effort: Pulmonary effort is normal. No respiratory distress.     Breath sounds: Normal breath sounds. No wheezing.  Abdominal:     General: Bowel sounds are normal. There is no distension.     Palpations: Abdomen is soft.     Tenderness: There is abdominal tenderness in the right upper quadrant and epigastric area. There is no right CVA tenderness, left CVA tenderness, guarding or rebound.  Musculoskeletal:        General: Normal range of motion.     Cervical back: Normal range of motion and neck supple.  Skin:    General: Skin is warm and dry.  Neurological:     General: No focal deficit present.     Mental Status: She is alert and oriented to person, place, and time.     ED Results / Procedures / Treatments   Labs (all labs ordered are listed, but only abnormal results are displayed) Labs Reviewed  COMPREHENSIVE METABOLIC PANEL - Abnormal;  Notable for the following components:      Result Value   Glucose, Bld 152 (*)    Calcium 8.4 (*)    All other components within normal limits  URINALYSIS, ROUTINE W REFLEX MICROSCOPIC - Abnormal; Notable for the following components:   APPearance HAZY (*)    All other components within normal limits  LIPASE, BLOOD  CBC  PREGNANCY, URINE    EKG None  Radiology No results found.  Procedures Procedures    Medications Ordered in ED Medications - No data to display  ED Course/ Medical Decision Making/ A&P  Patient presenting here with complaints of abdominal pain as described in the HPI.  Work-up initiated including laboratory studies, urinalysis, and CT scan of the abdomen and pelvis.  The studies are all essentially unremarkable.  At this point, cause of her  discomfort is unclear, but nothing appears emergent.  I feel as though patient can safely be discharged with anti-inflammatory medications, tramadol, rest, and follow-up as needed.  Final Clinical Impression(s) / ED Diagnoses Final diagnoses:  None    Rx / DC Orders ED Discharge Orders     None         Veryl Speak, MD 06/26/22 [phone]

## 2022-11-13 ENCOUNTER — Emergency Department (HOSPITAL_BASED_OUTPATIENT_CLINIC_OR_DEPARTMENT_OTHER): Payer: BC Managed Care – PPO

## 2022-11-13 ENCOUNTER — Encounter (HOSPITAL_BASED_OUTPATIENT_CLINIC_OR_DEPARTMENT_OTHER): Payer: Self-pay

## 2022-11-13 ENCOUNTER — Other Ambulatory Visit: Payer: Self-pay

## 2022-11-13 ENCOUNTER — Emergency Department (HOSPITAL_BASED_OUTPATIENT_CLINIC_OR_DEPARTMENT_OTHER)
Admission: EM | Admit: 2022-11-13 | Discharge: 2022-11-14 | Discharge status: Against Medical Advice | Payer: BC Managed Care – PPO | Attending: Emergency Medicine | Admitting: Emergency Medicine

## 2022-11-13 DIAGNOSIS — W458XXA Other foreign body or object entering through skin, initial encounter: Secondary | ICD-10-CM | POA: Insufficient documentation

## 2022-11-13 DIAGNOSIS — Z5321 Procedure and treatment not carried out due to patient leaving prior to being seen by health care provider: Secondary | ICD-10-CM | POA: Diagnosis not present

## 2022-11-13 DIAGNOSIS — T189XXA Foreign body of alimentary tract, part unspecified, initial encounter: Secondary | ICD-10-CM | POA: Insufficient documentation

## 2022-11-13 NOTE — ED Notes (Signed)
Entered patients room after patient rang the call bell stating she was having difficulty breathing, Upon auscultation patients BS were = no rales, rhonchi, wheezes or stridor, Auscultated trachea, no stridor noted. BBS =. Oxygen saturation 100% HR 80. Patient in no distress.

## 2022-11-13 NOTE — ED Triage Notes (Signed)
Pt reports she swallowed a piece of peppermint and it is stuck in her throat 30 min ago. Pt coughing and spitting up. Speaking in full sentences; no respiratory distress noted.

## 2022-11-13 NOTE — ED Notes (Addendum)
Pt refused vitals, complaining of wait time.  RN offered pt water to assess that pt had patent esophagus, pt refused stating "they gave me a soda out front, I dont want water back here."  Pt speaking in full, clear sentences, denies feeling of obstruction in throat. Pt declined to wait to be evaluated by EDP. Ambulatory to ED exit with visitor, steady gait.

## 2023-01-19 ENCOUNTER — Encounter (HOSPITAL_BASED_OUTPATIENT_CLINIC_OR_DEPARTMENT_OTHER): Payer: Self-pay | Admitting: Emergency Medicine

## 2023-01-19 ENCOUNTER — Other Ambulatory Visit: Payer: Self-pay

## 2023-01-19 ENCOUNTER — Emergency Department (HOSPITAL_BASED_OUTPATIENT_CLINIC_OR_DEPARTMENT_OTHER)
Admission: EM | Admit: 2023-01-19 | Discharge: 2023-01-19 | Disposition: A | Discharge status: Home / Self Care | Payer: BC Managed Care – PPO | Attending: Emergency Medicine | Admitting: Emergency Medicine

## 2023-01-19 DIAGNOSIS — I10 Essential (primary) hypertension: Secondary | ICD-10-CM | POA: Insufficient documentation

## 2023-01-19 DIAGNOSIS — N760 Acute vaginitis: Secondary | ICD-10-CM | POA: Diagnosis not present

## 2023-01-19 DIAGNOSIS — B9689 Other specified bacterial agents as the cause of diseases classified elsewhere: Secondary | ICD-10-CM | POA: Insufficient documentation

## 2023-01-19 LAB — URINALYSIS, ROUTINE W REFLEX MICROSCOPIC
Bilirubin Urine: NEGATIVE
Glucose, UA: NEGATIVE mg/dL
Hgb urine dipstick: NEGATIVE
Ketones, ur: NEGATIVE mg/dL
Leukocytes,Ua: NEGATIVE
Nitrite: NEGATIVE
Protein, ur: NEGATIVE mg/dL
Specific Gravity, Urine: 1.025 (ref 1.005–1.030)
pH: 6 (ref 5.0–8.0)

## 2023-01-19 LAB — WET PREP, GENITAL
Sperm: NONE SEEN
Trich, Wet Prep: NONE SEEN
WBC, Wet Prep HPF POC: >=10 — AB (ref <10)
Yeast Wet Prep HPF POC: NONE SEEN

## 2023-01-19 LAB — PREGNANCY, URINE: Preg Test, Ur: NEGATIVE

## 2023-01-19 MED ORDER — FLUCONAZOLE 150 MG PO TABS
150.0000 mg | ORAL_TABLET | Freq: Once | ORAL | Status: AC
  Administered 2023-01-19: 150 mg via ORAL
  Filled 2023-01-19: qty 1

## 2023-01-19 MED ORDER — METRONIDAZOLE 500 MG PO TABS: 500.0000 mg | ORAL_TABLET | Freq: Two times a day (BID) | ORAL | 0 refills | Status: AC

## 2023-01-19 NOTE — ED Provider Notes (Signed)
[place] [facility] Provider Note   CSN: [id number] Arrival date & time: 01/19/23  1228     History  Chief Complaint  Patient presents with   Vaginitis    Jessica Murray is a 32 y.o. female.  Patient presents the emergency department complaining of possible vaginal yeast infection.  Patient states she has had these in the past current symptoms feel similar.  She denies itching secondary to using a new feminine wash.  She denies new sexual partners and states she was screened recently for STIs.  She denies discharge, dysuria, vaginal bleeding, systemic symptoms such as fever.  Past medical history significant for hypertension  HPI     Home Medications Prior to Admission medications   Medication Sig Start Date End Date Taking? Authorizing Provider  metroNIDAZOLE (FLAGYL) 500 MG tablet Take 1 tablet (500 mg total) by mouth 2 (two) times daily. 01/19/23  Yes Darrick Grinder, PA-C  benzonatate (TESSALON) 200 MG capsule Take 1 capsule (200 mg total) by mouth 3 (three) times daily as needed for cough. 10/04/21   Dartha Lodge, PA-C  hydrocortisone cream 1 % Apply to affected area 2 times daily 02/26/21   Caccavale, Sophia, PA-C  medroxyPROGESTERone (PROVERA) 10 MG tablet Take by mouth. 03/18/18   [provider]  predniSONE (DELTASONE) 20 MG tablet Take 2 tablets (40 mg total) by mouth daily with breakfast. For the next four days 02/28/18   Gerhard Munch, MD  traMADol (ULTRAM) 50 MG tablet Take 1 tablet (50 mg total) by mouth every 6 (six) hours as needed. 06/26/22   Geoffery Lyons, MD      Allergies    Patient has no known allergies.    Review of Systems   Review of Systems  Physical Exam Updated Vital Signs BP (!) 133/96 (BP Location: Left Arm)   Pulse (!) 102   Temp 98 F (36.7 C) (Oral)   Resp 18   Ht 5\' 11"  (1.803 m)   Wt 113.4 kg   LMP 11/30/2022 (Exact Date)   SpO2 99%   BMI 34.87 kg/m  Physical Exam HENT:     Head:  Normocephalic and atraumatic.  Eyes:     Conjunctiva/sclera: Conjunctivae normal.  Pulmonary:     Effort: Pulmonary effort is normal. No respiratory distress.  Genitourinary:    Comments: Deferred Musculoskeletal:        General: No signs of injury.     Cervical back: Normal range of motion.  Skin:    General: Skin is dry.  Neurological:     Mental Status: She is alert.  Psychiatric:        Speech: Speech normal.        Behavior: Behavior normal.     ED Results / Procedures / Treatments   Labs (all labs ordered are listed, but only abnormal results are displayed) Labs Reviewed  WET PREP, GENITAL - Abnormal; Notable for the following components:      Result Value   Clue Cells Wet Prep HPF POC PRESENT (*)    WBC, Wet Prep HPF POC >=10 (*)    All other components within normal limits  PREGNANCY, URINE  URINALYSIS, ROUTINE W REFLEX MICROSCOPIC  GC/CHLAMYDIA PROBE AMP ([place]) NOT AT [facility]    EKG None  Radiology No results found.  Procedures Procedures    Medications Ordered in ED Medications  fluconazole (DIFLUCAN) tablet 150 mg (150 mg Oral Given 01/19/23 1458)    ED Course/ Medical  Decision Making/ A&P                             Medical Decision Making Amount and/or Complexity of Data Reviewed Labs: ordered.  Risk Prescription drug management.   Patient presents with a chief complaint of vaginal itching.  Differential diagnosis includes but is not limited to vulvovaginal candidiasis, vaginitis, trichomonas, BV, gonorrhea, chlamydia, others  I have ordered and reviewed lab results.  Pertinent results include negative pregnancy test and unremarkable UA. Wet prep with clue cells.   I ordered the patient Diflucan for possible yeast infection.  Upon reassessment the patient was doing well.   Plan to discharge patient at this time.  Lab workup consistent with BV. Plan to discharge on metronidazole.  Will follow for results of wet prep/GC chlamydia probe  and will send appropriate antibiotics if needed.  Patient to follow-up with primary care/OB/GYN.         Final Clinical Impression(s) / ED Diagnoses Final diagnoses:  Acute vaginitis  BV (bacterial vaginosis)    Rx / DC Orders ED Discharge Orders          Ordered    metroNIDAZOLE (FLAGYL) 500 MG tablet  2 times daily        01/19/23 1500              Pamala Duffel 01/19/23 1500    Alvira Monday, MD 01/19/23 2153

## 2023-01-19 NOTE — ED Notes (Signed)
D/c paperwork reviewed with pt, including prescriptions and follow up care.  All questions and/or concerns addressed at time of d/c.  No further needs expressed. . Pt verbalized understanding, Ambulatory without assistance to ED exit, NAD.   

## 2023-01-19 NOTE — ED Triage Notes (Signed)
Patient c/o yeast infection symptoms since Thursday.

## 2023-01-19 NOTE — Discharge Instructions (Addendum)
You were administered Diflucan for possible yeast infection today.  Your lab workup was significant for bacterial vaginosis.  I have sent a prescription for metronidazole to be taken until completed.   Please follow-up with your OB/GYN for further evaluation and management.

## 2023-01-22 LAB — GC/CHLAMYDIA PROBE AMP (~~LOC~~) NOT AT ARMC
Chlamydia: NEGATIVE
Comment: NEGATIVE
Comment: NORMAL
Neisseria Gonorrhea: NEGATIVE

## 2023-02-26 ENCOUNTER — Other Ambulatory Visit: Payer: Self-pay

## 2023-02-26 ENCOUNTER — Emergency Department (HOSPITAL_BASED_OUTPATIENT_CLINIC_OR_DEPARTMENT_OTHER)
Admission: EM | Admit: 2023-02-26 | Discharge: 2023-02-26 | Disposition: A | Discharge status: Home / Self Care | Payer: BC Managed Care – PPO | Attending: Emergency Medicine | Admitting: Emergency Medicine

## 2023-02-26 ENCOUNTER — Encounter (HOSPITAL_BASED_OUTPATIENT_CLINIC_OR_DEPARTMENT_OTHER): Payer: Self-pay | Admitting: Emergency Medicine

## 2023-02-26 DIAGNOSIS — I10 Essential (primary) hypertension: Secondary | ICD-10-CM | POA: Insufficient documentation

## 2023-02-26 DIAGNOSIS — R102 Pelvic and perineal pain: Secondary | ICD-10-CM | POA: Diagnosis present

## 2023-02-26 DIAGNOSIS — R300 Dysuria: Secondary | ICD-10-CM | POA: Insufficient documentation

## 2023-02-26 DIAGNOSIS — N949 Unspecified condition associated with female genital organs and menstrual cycle: Secondary | ICD-10-CM

## 2023-02-26 LAB — PREGNANCY, URINE: Preg Test, Ur: NEGATIVE

## 2023-02-26 LAB — URINALYSIS, ROUTINE W REFLEX MICROSCOPIC
Bilirubin Urine: NEGATIVE
Glucose, UA: NEGATIVE mg/dL
Hgb urine dipstick: NEGATIVE
Ketones, ur: NEGATIVE mg/dL
Leukocytes,Ua: NEGATIVE
Nitrite: NEGATIVE
Protein, ur: NEGATIVE mg/dL
Specific Gravity, Urine: >=1.03 (ref 1.005–1.030)
pH: 5 (ref 5.0–8.0)

## 2023-02-26 NOTE — ED Notes (Signed)
Pt. Left before the Wet Prep and the GC Chlamydia was collected.    She was aware of the need for all collections/  PAC [place]  had complete conversation with the Pt. About the need and treatment if positive results.

## 2023-02-26 NOTE — Discharge Instructions (Addendum)
You are seen in the emergency department today for vaginal discomfort.  Your urine sample was negative for urinary tract infection.  We collected swabs that test for bacterial vaginosis, yeast infection, and several STDs.  I should be able to call you with some results soon.  Your gonorrhea and Chlamydia testing will take several days to result.  Please follow-up the results on MyChart.  If you have any positive testing, please go to the health department for necessary treatment.

## 2023-02-26 NOTE — ED Triage Notes (Signed)
Was screened for STD last week, negative . Dx vaginosis , some relief with monistat.  Reports dysuria. No vaginal discharge or bleeding . No abd pain .

## 2023-02-26 NOTE — ED Provider Notes (Signed)
[place] [facility] Provider Note   CSN: [id number] Arrival date & time: 02/26/23  1318     History  Chief Complaint  Patient presents with   Vaginal Pain    Jessica Murray is a 32 y.o. female who presents the emergency department complaining of vaginal discomfort.  States that she was tested for STDs last week, and they were negative.  She was diagnosed with a general vaginosis, and says she has had a little bit of relief from using Monistat.  Also having some dysuria.  No vaginal discharge or bleeding.  No abdominal pain, no fever.   Vaginal Pain       Home Medications Prior to Admission medications   Medication Sig Start Date End Date Taking? Authorizing Provider  benzonatate (TESSALON) 200 MG capsule Take 1 capsule (200 mg total) by mouth 3 (three) times daily as needed for cough. 10/04/21   Dartha Lodge, PA-C  hydrocortisone cream 1 % Apply to affected area 2 times daily 02/26/21   Caccavale, Sophia, PA-C  medroxyPROGESTERone (PROVERA) 10 MG tablet Take by mouth. 03/18/18   [provider]  metroNIDAZOLE (FLAGYL) 500 MG tablet Take 1 tablet (500 mg total) by mouth 2 (two) times daily. 01/19/23   Darrick Grinder, PA-C  predniSONE (DELTASONE) 20 MG tablet Take 2 tablets (40 mg total) by mouth daily with breakfast. For the next four days 02/28/18   Gerhard Munch, MD  traMADol (ULTRAM) 50 MG tablet Take 1 tablet (50 mg total) by mouth every 6 (six) hours as needed. 06/26/22   Geoffery Lyons, MD      Allergies    Patient has no known allergies.    Review of Systems   Review of Systems  Genitourinary:  Positive for dysuria and vaginal pain.  All other systems reviewed and are negative.   Physical Exam Updated Vital Signs BP (!) 121/91 (BP Location: Left Arm)   Pulse 99   Temp 98.8 F (37.1 C) (Oral)   Resp 18   Wt 114.3 kg   SpO2 99%   BMI 35.15 kg/m  Physical Exam Vitals and nursing note reviewed.   Constitutional:      Appearance: Normal appearance.  HENT:     Head: Normocephalic and atraumatic.  Eyes:     Conjunctiva/sclera: Conjunctivae normal.  Pulmonary:     Effort: Pulmonary effort is normal. No respiratory distress.  Skin:    General: Skin is warm and dry.  Neurological:     Mental Status: She is alert.  Psychiatric:        Mood and Affect: Mood normal.        Behavior: Behavior normal.     ED Results / Procedures / Treatments   Labs (all labs ordered are listed, but only abnormal results are displayed) Labs Reviewed  WET PREP, GENITAL  URINALYSIS, ROUTINE W REFLEX MICROSCOPIC  PREGNANCY, URINE  GC/CHLAMYDIA PROBE AMP ([place]) NOT AT [facility]    EKG None  Radiology No results found.  Procedures Procedures    Medications Ordered in ED Medications - No data to display  ED Course/ Medical Decision Making/ A&P                             Medical Decision Making Amount and/or Complexity of Data Reviewed Labs: ordered.  This patient is a 32 y.o. female  who presents to the ED for concern of vaginal discomfort and  dysuria.   Differential diagnoses prior to evaluation: The emergent differential diagnosis includes, but is not limited to,  STD, PID, UTI, pregnancy. This is not an exhaustive differential.   Past Medical History / Co-morbidities / Social History: HTN, LEEP procedure  Additional history: Chart reviewed. Pertinent results include: Reviewed ER visit from 4/13 in which patient was diagnosed with bacterial vaginosis, treated with antibiotics.  Reviewed primary care visit from 5/14 at outside facility in which patient presented requesting STD testing.  She was negative for yeast, trichomonas, chlamydia, gonorrhea.  Physical Exam: Physical exam performed. The pertinent findings include: Normal vital signs, no acute distress.  Sitting in exam bed comfortably.  Lab Tests/Imaging studies: I personally interpreted labs/imaging and the pertinent  results include: Urinalysis for hematuria or infection.  Disposition: After consideration of the diagnostic results and the patients response to treatment, I feel that emergency department workup does not suggest an emergent condition requiring admission or immediate intervention beyond what has been performed at this time. The plan is: Discharged home with pending swabs.  Patient is very upset about her wait today.  I explained that I can call her with her wet prep results, but she will have to follow-up on her GC testing when it results in a few days.   Upon nurse entering to obtain STD testing, patient had left without notifying staff.   Final Clinical Impression(s) / ED Diagnoses Final diagnoses:  Vaginal discomfort    Rx / DC Orders ED Discharge Orders     None      Portions of this report may have been transcribed using voice recognition software. Every effort was made to ensure accuracy; however, inadvertent computerized transcription errors may be present.    Su Monks, PA-C 02/26/23 1627    Melene Plan, DO 02/26/23 1627

## 2023-11-29 ENCOUNTER — Emergency Department (HOSPITAL_BASED_OUTPATIENT_CLINIC_OR_DEPARTMENT_OTHER)
Admission: EM | Admit: 2023-11-29 | Discharge: 2023-11-29 | Disposition: A | Discharge status: Home / Self Care | Payer: BC Managed Care – PPO | Attending: Emergency Medicine | Admitting: Emergency Medicine

## 2023-11-29 ENCOUNTER — Emergency Department (HOSPITAL_BASED_OUTPATIENT_CLINIC_OR_DEPARTMENT_OTHER): Payer: BC Managed Care – PPO

## 2023-11-29 ENCOUNTER — Other Ambulatory Visit: Payer: Self-pay

## 2023-11-29 ENCOUNTER — Encounter (HOSPITAL_BASED_OUTPATIENT_CLINIC_OR_DEPARTMENT_OTHER): Payer: Self-pay | Admitting: Emergency Medicine

## 2023-11-29 DIAGNOSIS — W19XXXA Unspecified fall, initial encounter: Secondary | ICD-10-CM

## 2023-11-29 DIAGNOSIS — W010XXA Fall on same level from slipping, tripping and stumbling without subsequent striking against object, initial encounter: Secondary | ICD-10-CM | POA: Diagnosis not present

## 2023-11-29 DIAGNOSIS — M25572 Pain in left ankle and joints of left foot: Secondary | ICD-10-CM | POA: Diagnosis not present

## 2023-11-29 DIAGNOSIS — M25552 Pain in left hip: Secondary | ICD-10-CM | POA: Insufficient documentation

## 2023-11-29 LAB — PREGNANCY, URINE: Preg Test, Ur: NEGATIVE

## 2023-11-29 NOTE — ED Triage Notes (Signed)
Pt POV in wheelchair- c/o of mechanical fall onto cement, landed on L side.  C/o L hip pain, radiating down leg. Denies LOC, denies blood thinners. Reports tingling in L ankle.

## 2023-11-29 NOTE — ED Provider Notes (Signed)
[place] [facility] Provider Note   CSN: [id number] Arrival date & time: 11/29/23  1823     History  Chief Complaint  Patient presents with   Hip Pain   Ankle Pain    Jessica Murray is a 33 y.o. female here for evaluation mechanical fall.  Slipped, fell and landed on on her left lateral hip and inverted her left ankle.  She has had pain since.  She denies hitting her head, LOC or anticoagulation.  Ambulatory PTA however has some pain with ambulation. No numbness, weakness, incontinence, midline back pain.  HPI     Home Medications Prior to Admission medications   Medication Sig Start Date End Date Taking? Authorizing Provider  benzonatate (TESSALON) 200 MG capsule Take 1 capsule (200 mg total) by mouth 3 (three) times daily as needed for cough. 10/04/21   Rosezella Rumpf, PA-C  hydrocortisone cream 1 % Apply to affected area 2 times daily 02/26/21   Caccavale, Sophia, PA-C  medroxyPROGESTERone (PROVERA) 10 MG tablet Take by mouth. 03/18/18   [provider]  metroNIDAZOLE (FLAGYL) 500 MG tablet Take 1 tablet (500 mg total) by mouth 2 (two) times daily. 01/19/23   Darrick Grinder, PA-C  predniSONE (DELTASONE) 20 MG tablet Take 2 tablets (40 mg total) by mouth daily with breakfast. For the next four days 02/28/18   Gerhard Munch, MD  traMADol (ULTRAM) 50 MG tablet Take 1 tablet (50 mg total) by mouth every 6 (six) hours as needed. 06/26/22   Geoffery Lyons, MD      Allergies    Patient has no known allergies.    Review of Systems   Review of Systems  Constitutional: Negative.   HENT: Negative.    Respiratory: Negative.    Cardiovascular: Negative.   Gastrointestinal: Negative.   Genitourinary: Negative.   Musculoskeletal:        Left hip pain, left ankle pain  Skin: Negative.   Neurological: Negative.   All other systems reviewed and are negative.   Physical Exam Updated Vital Signs BP (!) 135/96 (BP Location: Right  Arm)   Pulse 100   Temp 97.7 F (36.5 C) (Oral)   Resp 20   Ht 5\' 11"  (1.803 m)   Wt 120.2 kg   LMP 10/23/2023 (Approximate)   SpO2 95%   BMI 36.96 kg/m  Physical Exam Vitals and nursing note reviewed.  Constitutional:      General: She is not in acute distress.    Appearance: She is well-developed. She is not ill-appearing.  HENT:     Head: Atraumatic.  Eyes:     Pupils: Pupils are equal, round, and reactive to light.  Cardiovascular:     Rate and Rhythm: Normal rate.     Pulses:          Radial pulses are 2+ on the left side.       Dorsalis pedis pulses are 2+ on the left side.  Pulmonary:     Effort: No respiratory distress.  Abdominal:     General: There is no distension.  Musculoskeletal:        General: Normal range of motion.     Cervical back: Normal range of motion.     Comments: Tenderness lateral aspect left hip.  No bony tenderness femur, midshaft, proximal tib-fib, left foot.  Tenderness left lateral malleolus.  Able to plantarflex and dorsiflex.  Skin:    General: Skin is warm and dry.  Capillary Refill: Capillary refill takes less than 2 seconds.     Comments: No edema, erythema or warmth  Neurological:     General: No focal deficit present.     Mental Status: She is alert.     Sensory: Sensation is intact.     Motor: Motor function is intact.     Gait: Gait is intact.  Psychiatric:        Mood and Affect: Mood normal.     ED Results / Procedures / Treatments   Labs (all labs ordered are listed, but only abnormal results are displayed) Labs Reviewed  PREGNANCY, URINE    EKG None  Radiology DG Ankle Complete Left Result Date: 11/29/2023 CLINICAL DATA:  Mechanical fall at 1800 hours.  Left ankle pain. EXAM: LEFT ANKLE COMPLETE - 3+ VIEW COMPARISON:  Left foot radiographs 07/01/2016 FINDINGS: The ankle mortise is symmetric and intact. Joint spaces preserved. Minimal dorsal talonavicular degenerative spurring is similar to prior. Small plantar  and posterior calcaneal heel spurs. Prominent medial aspect of the navicular again consistent with normal variant type 3 fused os naviculare. No acute fracture or dislocation. IMPRESSION: 1. No acute fracture. 2. Small plantar and posterior calcaneal heel spurs. Electronically Signed   By: Neita Garnet M.D.   On: 11/29/2023 20:02   DG Hip Unilat With Pelvis 2-3 Views Left Result Date: 11/29/2023 CLINICAL DATA:  Mechanical fall landing on left side. Left hip pain. EXAM: DG HIP (WITH OR WITHOUT PELVIS) 2-3V LEFT COMPARISON:  CT abdomen and pelvis 06/26/2022, CT abdomen and pelvis 09/03/2021 FINDINGS: Normal bone mineralization. The bilateral sacroiliac, bilateral femoroacetabular, and pubic symphysis joint spaces are maintained. Mild bilateral superolateral acetabular degenerative osteophytes. Normal morphology of the femoral head-neck junction without CAM-type bump deformity. No acute fracture or dislocation. Unchanged conglomeration of calcifications overlying the right adnexa, stable from 06/26/2022 and 09/03/2021 CTs. IMPRESSION: 1. No acute fracture. 2. Very mild bilateral femoroacetabular osteoarthritis. Electronically Signed   By: Neita Garnet M.D.   On: 11/29/2023 20:01    Procedures .Ortho Injury Treatment  Date/Time: 11/29/2023 8:42 PM  Performed by: Ralph Leyden A, PA-C Authorized by: Linwood Dibbles, PA-C   Consent:    Consent obtained:  Verbal   Consent given by:  Patient   Risks discussed:  Fracture, nerve damage, restricted joint movement, vascular damage, stiffness, recurrent dislocation and irreducible dislocation   Alternatives discussed:  No treatment, alternative treatment, immobilization, referral and delayed treatmentPre-procedure neurovascular assessment: neurovascularly intact Pre-procedure distal perfusion: normal Pre-procedure neurological function: normal Pre-procedure range of motion: normal  Anesthesia: Local anesthesia used: no  Patient sedated:  NoImmobilization: brace Splint type: ASo brace. Splint Applied by: ED Nurse Post-procedure neurovascular assessment: post-procedure neurovascularly intact Post-procedure distal perfusion: normal Post-procedure neurological function: normal Post-procedure range of motion: normal       Medications Ordered in ED Medications - No data to display  ED Course/ Medical Decision Making/ A&P   33 year old here for evaluation mechanical fall.  Landed on left lateral hip, inverted her left ankle.  Denies hitting head, LOC or anticoagulation.  Tenderness left lateral malleolus and left lateral hip.  Nontender midshaft, proximal tib-fib, femur, foot.  No midline C/T/L tenderness. NV intact.  Imaging personally viewed and interpreted:  Pregnancy test negative X-ray left ankle negative for acute fracture, dislocation X-ray left hip with pelvis shows no fracture, dislocation  Patient reassessed.  Ambulatory low suspicion for occult fracture, septic joint, gout, hemarthrosis.  Patient placed in ASO brace.  Discussed RICE for sx  management.  Will have follow-up outpatient, return for any worsening symptoms.  The patient has been appropriately medically screened and/or stabilized in the ED. I have low suspicion for any other emergent medical condition which would require further screening, evaluation or treatment in the ED or require inpatient management.  Patient is hemodynamically stable and in no acute distress.  Patient able to ambulate in department prior to ED.  Evaluation does not show acute pathology that would require ongoing or additional emergent interventions while in the emergency department or further inpatient treatment.  I have discussed the diagnosis with the patient and answered all questions.  Pain is been managed while in the emergency department and patient has no further complaints prior to discharge.  Patient is comfortable with plan discussed in room and is stable for discharge at  this time.  I have discussed strict return precautions for returning to the emergency department.  Patient was encouraged to follow-up with PCP/specialist refer to at discharge.                                  Medical Decision Making Amount and/or Complexity of Data Reviewed Labs: ordered. Radiology: ordered.           Final Clinical Impression(s) / ED Diagnoses Final diagnoses:  Fall, initial encounter    Rx / DC Orders ED Discharge Orders     None         Carmela Piechowski A, PA-C 11/29/23 2043    Pricilla Loveless, MD 11/29/23 2257

## 2023-11-29 NOTE — ED Notes (Signed)
 Cannot discharge, registration in chart.

## 2023-11-29 NOTE — Discharge Instructions (Signed)
It was a pleasure taking care of you here today.  Your x-rays today did not show any evidence of broken bones.  It is possible that you have a sprain to your ankle.  We have placed you in a brace.  I do recommend Tylenol, Motrin, ice and elevation.  If you are still having pain after about a week at follow-up with an orthopedist.  If you do not have 1 there is 1 listed in your discharge paperwork.  You will need to call to schedule an appointment at that time.

## 2023-11-29 NOTE — ED Notes (Signed)
 Discharge paperwork reviewed entirely with patient, including follow up care. Pain was under control. No prescriptions were called in, but all questions were addressed.  Pt verbalized understanding as well as all parties involved. No questions or concerns voiced at the time of discharge. No acute distress noted.   Pt ambulated out to PVA without incident or assistance.  Pt advised they will seek followup care with a specialist and followup with their PCP.

## 2023-12-08 ENCOUNTER — Emergency Department (HOSPITAL_BASED_OUTPATIENT_CLINIC_OR_DEPARTMENT_OTHER)

## 2023-12-08 ENCOUNTER — Other Ambulatory Visit: Payer: Self-pay

## 2023-12-08 ENCOUNTER — Encounter (HOSPITAL_BASED_OUTPATIENT_CLINIC_OR_DEPARTMENT_OTHER): Payer: Self-pay

## 2023-12-08 ENCOUNTER — Emergency Department (HOSPITAL_BASED_OUTPATIENT_CLINIC_OR_DEPARTMENT_OTHER)
Admission: EM | Admit: 2023-12-08 | Discharge: 2023-12-08 | Disposition: A | Discharge status: Home / Self Care | Attending: Emergency Medicine | Admitting: Emergency Medicine

## 2023-12-08 DIAGNOSIS — R112 Nausea with vomiting, unspecified: Secondary | ICD-10-CM | POA: Diagnosis not present

## 2023-12-08 DIAGNOSIS — R1032 Left lower quadrant pain: Secondary | ICD-10-CM | POA: Insufficient documentation

## 2023-12-08 DIAGNOSIS — R1031 Right lower quadrant pain: Secondary | ICD-10-CM | POA: Insufficient documentation

## 2023-12-08 LAB — URINALYSIS, ROUTINE W REFLEX MICROSCOPIC
Bilirubin Urine: NEGATIVE
Glucose, UA: NEGATIVE mg/dL
Hgb urine dipstick: NEGATIVE
Ketones, ur: NEGATIVE mg/dL
Leukocytes,Ua: NEGATIVE
Nitrite: NEGATIVE
Protein, ur: NEGATIVE mg/dL
Specific Gravity, Urine: >=1.03 (ref 1.005–1.030)
pH: 5.5 (ref 5.0–8.0)

## 2023-12-08 LAB — COMPREHENSIVE METABOLIC PANEL
ALT: 19 U/L (ref 0–44)
AST: 16 U/L (ref 15–41)
Albumin: 3.8 g/dL (ref 3.5–5.0)
Alkaline Phosphatase: 101 U/L (ref 38–126)
Anion gap: 8 (ref 5–15)
BUN: 12 mg/dL (ref 6–20)
CO2: 24 mmol/L (ref 22–32)
Calcium: 9 mg/dL (ref 8.9–10.3)
Chloride: 103 mmol/L (ref 98–111)
Creatinine, Ser: 0.79 mg/dL (ref 0.44–1.00)
GFR, Estimated: >60 mL/min (ref >60)
Glucose, Bld: 125 mg/dL — ABNORMAL HIGH (ref 70–99)
Potassium: 3.8 mmol/L (ref 3.5–5.1)
Sodium: 135 mmol/L (ref 135–145)
Total Bilirubin: 0.6 mg/dL (ref 0.0–1.2)
Total Protein: 7.1 g/dL (ref 6.5–8.1)

## 2023-12-08 LAB — CBC
HCT: 42.8 % (ref 36.0–46.0)
Hemoglobin: 13.9 g/dL (ref 12.0–15.0)
MCH: 28.8 pg (ref 26.0–34.0)
MCHC: 32.5 g/dL (ref 30.0–36.0)
MCV: 88.6 fL (ref 80.0–100.0)
Platelets: 401 10*3/uL — ABNORMAL HIGH (ref 150–400)
RBC: 4.83 MIL/uL (ref 3.87–5.11)
RDW: 13.4 % (ref 11.5–15.5)
WBC: 8.9 10*3/uL (ref 4.0–10.5)
nRBC: 0 % (ref 0.0–0.2)

## 2023-12-08 LAB — PREGNANCY, URINE: Preg Test, Ur: NEGATIVE

## 2023-12-08 LAB — LIPASE, BLOOD: Lipase: 22 U/L (ref 11–51)

## 2023-12-08 MED ORDER — ONDANSETRON 4 MG PO TBDP: 4.0000 mg | ORAL_TABLET | Freq: Three times a day (TID) | ORAL | 0 refills | Status: AC | PRN

## 2023-12-08 MED ORDER — KETOROLAC TROMETHAMINE 15 MG/ML IJ SOLN
15.0000 mg | Freq: Once | INTRAMUSCULAR | Status: AC
  Administered 2023-12-08: 15 mg via INTRAVENOUS
  Filled 2023-12-08: qty 1

## 2023-12-08 MED ORDER — ONDANSETRON HCL 4 MG/2ML IJ SOLN: 4.0000 mg | Freq: Once | INTRAMUSCULAR | Status: DC | PRN

## 2023-12-08 NOTE — ED Notes (Signed)
Denies nausea at this time.  

## 2023-12-08 NOTE — ED Triage Notes (Signed)
 Pt reports menstrual cycle was supposed to start on Friday but that she has had severe abdominal cramping x2 weeks. Denies bleeding. N/V intermittently x 2 weeks. NAD noted in triage. Denies fever. Alternating diarrhea/constipation.

## 2023-12-08 NOTE — ED Provider Notes (Signed)
 [place] [facility] Provider Note   CSN: [id number] Arrival date & time: 12/08/23  1311     History  Chief Complaint  Patient presents with   Abdominal Cramping   Vomiting    Jessica Murray is a 33 y.o. female.  Patient with no significant past medical history presents to the emergency department today for evaluation of abdominal pain and cramping.  This has been ongoing for about 2 weeks.  She has intermittent vomiting, mainly after eating as well.  Pain is bilateral lower abdomen.  No vaginal bleeding or discharge.  She was due for her menstrual period about a week ago.  No fevers.  She has had some frequent soft stools.  No shortness of breath.  She has been taking some ibuprofen occasionally for cramping pain.  Otherwise no treatments.  She reports history of irregular periods, currently on medication to "regulate periods".       Home Medications Prior to Admission medications   Medication Sig Start Date End Date Taking? Authorizing Provider  benzonatate (TESSALON) 200 MG capsule Take 1 capsule (200 mg total) by mouth 3 (three) times daily as needed for cough. 10/04/21   Rosezella Rumpf, PA-C  hydrocortisone cream 1 % Apply to affected area 2 times daily 02/26/21   Caccavale, Sophia, PA-C  medroxyPROGESTERone (PROVERA) 10 MG tablet Take by mouth. 03/18/18   [provider]  metroNIDAZOLE (FLAGYL) 500 MG tablet Take 1 tablet (500 mg total) by mouth 2 (two) times daily. 01/19/23   Darrick Grinder, PA-C  predniSONE (DELTASONE) 20 MG tablet Take 2 tablets (40 mg total) by mouth daily with breakfast. For the next four days 02/28/18   Gerhard Munch, MD  traMADol (ULTRAM) 50 MG tablet Take 1 tablet (50 mg total) by mouth every 6 (six) hours as needed. 06/26/22   Geoffery Lyons, MD      Allergies    Patient has no known allergies.    Review of Systems   Review of Systems  Physical Exam Updated Vital Signs BP (!) 145/97 (BP  Location: Right Arm)   Pulse (!) 111   Temp 98.5 F (36.9 C) (Oral)   Resp 18   Ht 5\' 11"  (1.803 m)   Wt 120.2 kg   LMP 10/23/2023 (Approximate)   SpO2 99%   BMI 36.96 kg/m  Physical Exam Vitals and nursing note reviewed.  Constitutional:      General: She is not in acute distress.    Appearance: She is well-developed.  HENT:     Head: Normocephalic and atraumatic.     Right Ear: External ear normal.     Left Ear: External ear normal.     Nose: Nose normal.  Eyes:     Conjunctiva/sclera: Conjunctivae normal.  Cardiovascular:     Rate and Rhythm: Normal rate and regular rhythm.     Heart sounds: No murmur heard. Pulmonary:     Effort: No respiratory distress.     Breath sounds: No wheezing, rhonchi or rales.  Abdominal:     Palpations: Abdomen is soft.     Tenderness: There is abdominal tenderness in the right lower quadrant, suprapubic area and left lower quadrant. There is no guarding or rebound.     Comments: Mild abdominal tenderness, no wincing with palpation, rebound or guarding.  Musculoskeletal:     Cervical back: Normal range of motion and neck supple.     Right lower leg: No edema.     Left  lower leg: No edema.  Skin:    General: Skin is warm and dry.     Findings: No rash.  Neurological:     General: No focal deficit present.     Mental Status: She is alert. Mental status is at baseline.     Motor: No weakness.  Psychiatric:        Mood and Affect: Mood normal.     ED Results / Procedures / Treatments   Labs (all labs ordered are listed, but only abnormal results are displayed) Labs Reviewed  COMPREHENSIVE METABOLIC PANEL - Abnormal; Notable for the following components:      Result Value   Glucose, Bld 125 (*)    All other components within normal limits  CBC - Abnormal; Notable for the following components:   Platelets 401 (*)    All other components within normal limits  LIPASE, BLOOD  URINALYSIS, ROUTINE W REFLEX MICROSCOPIC  PREGNANCY,  URINE    EKG None  Radiology US Pelvis Complete Result Date: 12/08/2023 CLINICAL DATA:  Pelvic pain with diarrhea EXAM: TRANSABDOMINAL AND TRANSVAGINAL ULTRASOUND OF PELVIS TECHNIQUE: Both transabdominal and transvaginal ultrasound examinations of the pelvis were performed. Transabdominal technique was performed for global imaging of the pelvis including uterus, ovaries, adnexal regions, and pelvic cul-de-sac. It was necessary to proceed with endovaginal exam following the transabdominal exam to visualize the uterus endometrium adnexa. COMPARISON:  CT 06/26/2022, 09/03/2021 FINDINGS: Uterus Measurements: 7.1 x 3.6 x 5.6 cm = volume: 64.4 mL. No fibroids or other mass visualized. Endometrium Thickness: 6.9 mm.  No focal abnormality visualized. Right ovary Measurements: 3.8 x 2.7 x 2.8 cm = volume: 14.7 mL. Densely shadowing calcified right adnexal mass measuring 2.5 x 1.7 x 2.2 cm, stable since CT from 2022 and favored to represent benign process. Left ovary Measurements: 2.8 x 2.3 x 1.9 cm = volume: 6.1 mL. Normal appearance/no adnexal mass. Other findings No abnormal free fluid. IMPRESSION: 1. 2.5 cm densely calcified mass in the right adnexa, stable since 2022 CT. This could be seen in the setting of endometriosis or old calcified hemorrhagic cyst. Appearance not typical for dermoid. Calcified ovarian tumor is an additional consideration but lack of growth since 2022 is reassuring. Gynecology follow-up recommended. 2. Otherwise negative pelvic ultrasound Electronically Signed   By: Jasmine Pang M.D.   On: 12/08/2023 15:58   US Transvaginal Non-OB Result Date: 12/08/2023 CLINICAL DATA:  Pelvic pain with diarrhea EXAM: TRANSABDOMINAL AND TRANSVAGINAL ULTRASOUND OF PELVIS TECHNIQUE: Both transabdominal and transvaginal ultrasound examinations of the pelvis were performed. Transabdominal technique was performed for global imaging of the pelvis including uterus, ovaries, adnexal regions, and pelvic  cul-de-sac. It was necessary to proceed with endovaginal exam following the transabdominal exam to visualize the uterus endometrium adnexa. COMPARISON:  CT 06/26/2022, 09/03/2021 FINDINGS: Uterus Measurements: 7.1 x 3.6 x 5.6 cm = volume: 64.4 mL. No fibroids or other mass visualized. Endometrium Thickness: 6.9 mm.  No focal abnormality visualized. Right ovary Measurements: 3.8 x 2.7 x 2.8 cm = volume: 14.7 mL. Densely shadowing calcified right adnexal mass measuring 2.5 x 1.7 x 2.2 cm, stable since CT from 2022 and favored to represent benign process. Left ovary Measurements: 2.8 x 2.3 x 1.9 cm = volume: 6.1 mL. Normal appearance/no adnexal mass. Other findings No abnormal free fluid. IMPRESSION: 1. 2.5 cm densely calcified mass in the right adnexa, stable since 2022 CT. This could be seen in the setting of endometriosis or old calcified hemorrhagic cyst. Appearance not typical for dermoid.  Calcified ovarian tumor is an additional consideration but lack of growth since 2022 is reassuring. Gynecology follow-up recommended. 2. Otherwise negative pelvic ultrasound Electronically Signed   By: Jasmine Pang M.D.   On: 12/08/2023 15:58    Procedures Procedures    Medications Ordered in ED Medications  ondansetron (ZOFRAN) injection 4 mg (has no administration in time range)  ketorolac (TORADOL) 15 MG/ML injection 15 mg (15 mg Intravenous Given 12/08/23 1439)    ED Course/ Medical Decision Making/ A&P    Patient seen and examined. History obtained directly from patient.   Labs/EKG: Ordered CBC, CMP, lipase, UA and pregnancy  Imaging: None ordered  Medications/Fluids: None ordered  Most recent vital signs reviewed and are as follows: BP (!) 145/97 (BP Location: Right Arm)   Pulse (!) 111   Temp 98.5 F (36.9 C) (Oral)   Resp 18   Ht 5\' 11"  (1.803 m)   Wt 120.2 kg   LMP 10/23/2023 (Approximate)   SpO2 99%   BMI 36.96 kg/m   Initial impression: Lower abdominal pain  4:31 PM Reassessment  performed. Patient appears stable.  Comfortable.  No vomiting.  Labs personally reviewed and interpreted including: CBC unremarkable; CMP mild hyperglycemia otherwise unremarkable; lipase normal; UA without compelling signs of infection; pregnancy negative.  Imaging results reviewed including: Ultrasound of the pelvis which was ordered after discussion with patient and reassuring lab work.  This showed stable calcifications right adnexa compared to CT in 2022, otherwise unremarkable.  Reviewed pertinent lab work and imaging with patient at bedside. Questions answered.   Most current vital signs reviewed and are as follows: BP (!) 145/97 (BP Location: Right Arm)   Pulse (!) 111   Temp 98.5 F (36.9 C) (Oral)   Resp 18   Ht 5\' 11"  (1.803 m)   Wt 120.2 kg   LMP 11/05/2023 (Approximate)   SpO2 99%   BMI 36.96 kg/m   Plan: Discharge to home.   Prescriptions written for: Zofran  Other home care instructions discussed: OTC NSAIDs, warm compress  ED return instructions discussed: The patient was urged to return to the Emergency Department immediately with worsening of current symptoms, worsening abdominal pain, persistent vomiting, blood noted in stools, fever, or any other concerns. The patient verbalized understanding.   Follow-up instructions discussed: Patient encouraged to follow-up with their PCP/OBGYN in 3-5 days.  Patient states she does have an upcoming gynecology appointment.                                  Medical Decision Making Amount and/or Complexity of Data Reviewed Labs: ordered. Radiology: ordered.  Risk Prescription drug management.   For this patient's complaint of abdominal pain, the following conditions were considered on the differential diagnosis: gastritis/PUD, enteritis/duodenitis, appendicitis, cholelithiasis/cholecystitis, cholangitis, pancreatitis, ruptured viscus, colitis, diverticulitis, small/large bowel obstruction, proctitis, cystitis,  pyelonephritis, ureteral colic, aortic dissection, aortic aneurysm. In women, ectopic pregnancy, pelvic inflammatory disease, ovarian cysts, and tubo-ovarian abscess were also considered. Atypical chest etiologies were also considered including ACS, PE, and pneumonia.  The patient's vital signs, pertinent lab work and imaging were reviewed and interpreted as discussed in the ED course. Hospitalization was considered for further testing, treatments, or serial exams/observation. However as patient is well-appearing, has a stable exam, and reassuring studies today, I do not feel that they warrant admission at this time. This plan was discussed with the patient who verbalizes agreement and comfort with this  plan and seems reliable and able to return to the Emergency Department with worsening or changing symptoms.          Final Clinical Impression(s) / ED Diagnoses Final diagnoses:  Bilateral lower abdominal cramping  Nausea and vomiting, unspecified vomiting type    Rx / DC Orders ED Discharge Orders          Ordered    ondansetron (ZOFRAN-ODT) 4 MG disintegrating tablet  Every 8 hours PRN        12/08/23 1629              Renne Crigler, PA-C 12/08/23 1632    Rolan Bucco, MD 12/11/23 [phone]

## 2023-12-08 NOTE — ED Notes (Signed)
 Reviewed discharge instructions, follow up instructions, medications for discharge. Pt states understanding. Denies pain at this time. Ambulatory at discharge

## 2023-12-08 NOTE — Discharge Instructions (Signed)
 Please read and follow all provided instructions.  Your diagnoses today include:  1. Bilateral lower abdominal cramping   2. Nausea and vomiting, unspecified vomiting type     Tests performed today include: Blood cell counts and platelets Kidney and liver function tests Pancreas function test (called lipase) Urine test to look for infection A blood or urine test for pregnancy (women only): Negative Ultrasound of your pelvis showed stable calcifications in the right pelvis, otherwise no concerning findings today Vital signs. See below for your results today.   Medications prescribed:  Zofran (ondansetron) - for nausea and vomiting  Take any prescribed medications only as directed.  Home care instructions:  Follow any educational materials contained in this packet.  Follow-up instructions: Please follow-up with your primary care provider or OB/GYN in the next 3-5 days for further evaluation of your symptoms.    Return instructions:  SEEK IMMEDIATE MEDICAL ATTENTION IF: The pain does not go away or becomes severe  A temperature above 101F develops  Repeated vomiting occurs (multiple episodes)  The pain becomes localized to portions of the abdomen. The right side could possibly be appendicitis. In an adult, the left lower portion of the abdomen could be colitis or diverticulitis.  Blood is being passed in stools or vomit (bright red or black tarry stools)  You develop chest pain, difficulty breathing, dizziness or fainting, or become confused, poorly responsive, or inconsolable (young children) If you have any other emergent concerns regarding your health  Additional Information: Abdominal (belly) pain can be caused by many things. Your caregiver performed an examination and possibly ordered blood/urine tests and imaging (CT scan, x-rays, ultrasound). Many cases can be observed and treated at home after initial evaluation in the emergency department. Even though you are being  discharged home, abdominal pain can be unpredictable. Therefore, you need a repeated exam if your pain does not resolve, returns, or worsens. Most patients with abdominal pain don't have to be admitted to the hospital or have surgery, but serious problems like appendicitis and gallbladder attacks can start out as nonspecific pain. Many abdominal conditions cannot be diagnosed in one visit, so follow-up evaluations are very important.  Your vital signs today were: BP (!) 145/97 (BP Location: Right Arm)   Pulse (!) 111   Temp 98.5 F (36.9 C) (Oral)   Resp 18   Ht 5\' 11"  (1.803 m)   Wt 120.2 kg   LMP 11/05/2023 (Approximate)   SpO2 99%   BMI 36.96 kg/m  If your blood pressure (bp) was elevated above 135/85 this visit, please have this repeated by your doctor within one month. --------------

## 2024-04-24 ENCOUNTER — Emergency Department (HOSPITAL_BASED_OUTPATIENT_CLINIC_OR_DEPARTMENT_OTHER)
Admission: EM | Admit: 2024-04-24 | Discharge: 2024-04-24 | Disposition: A | Discharge status: Home / Self Care | Attending: Emergency Medicine | Admitting: Emergency Medicine

## 2024-04-24 ENCOUNTER — Encounter (HOSPITAL_BASED_OUTPATIENT_CLINIC_OR_DEPARTMENT_OTHER): Payer: Self-pay

## 2024-04-24 ENCOUNTER — Other Ambulatory Visit: Payer: Self-pay

## 2024-04-24 DIAGNOSIS — I10 Essential (primary) hypertension: Secondary | ICD-10-CM | POA: Diagnosis not present

## 2024-04-24 DIAGNOSIS — M545 Low back pain, unspecified: Secondary | ICD-10-CM | POA: Diagnosis present

## 2024-04-24 DIAGNOSIS — Z87891 Personal history of nicotine dependence: Secondary | ICD-10-CM | POA: Diagnosis not present

## 2024-04-24 MED ORDER — CYCLOBENZAPRINE HCL 10 MG PO TABS: 10.0000 mg | ORAL_TABLET | Freq: Two times a day (BID) | ORAL | 0 refills | Status: DC | PRN

## 2024-04-24 MED ORDER — LIDOCAINE 5 % EX PTCH: 1.0000 | MEDICATED_PATCH | CUTANEOUS | 0 refills | Status: AC

## 2024-04-24 MED ORDER — CYCLOBENZAPRINE HCL 10 MG PO TABS
10.0000 mg | ORAL_TABLET | Freq: Once | ORAL | Status: AC
  Administered 2024-04-24: 10 mg via ORAL
  Filled 2024-04-24: qty 1

## 2024-04-24 MED ORDER — LIDOCAINE 5 % EX PTCH
2.0000 | MEDICATED_PATCH | Freq: Once | CUTANEOUS | Status: DC
  Administered 2024-04-24: 2 via TRANSDERMAL
  Filled 2024-04-24: qty 2

## 2024-04-24 MED ORDER — DIAZEPAM 5 MG PO TABS: 5.0000 mg | ORAL_TABLET | Freq: Two times a day (BID) | ORAL | 0 refills | Status: DC | PRN

## 2024-04-24 MED ORDER — KETOROLAC TROMETHAMINE 30 MG/ML IJ SOLN
30.0000 mg | Freq: Once | INTRAMUSCULAR | Status: AC
  Administered 2024-04-24: 30 mg via INTRAMUSCULAR
  Filled 2024-04-24: qty 1

## 2024-04-24 MED ORDER — CELECOXIB 200 MG PO CAPS: 200.0000 mg | ORAL_CAPSULE | Freq: Two times a day (BID) | ORAL | 0 refills | Status: AC | PRN

## 2024-04-24 NOTE — Discharge Instructions (Addendum)
 As discussed, suspect your back pain is muscular.  Will send you home with anti-inflammatory, numbing patch as well as medications to take as needed for muscular spasms.  Recommend follow with your primary care for reassessment.

## 2024-04-24 NOTE — ED Triage Notes (Signed)
 Pt presents with low back pain that started after trying to pick up a glass table yesterday. The pain gradually became worse and is located to the L lumbar area without radiation. Denies any urinary or fecal incontinence.

## 2024-04-24 NOTE — ED Provider Notes (Signed)
 [place] [facility] Provider Note   CSN: [id number] Arrival date & time: 04/24/24  1451     Patient presents with: Back Pain   Jessica Murray is a 33 y.o. female.    Back Pain   33 year old female presents emergency department complaints of left back pain.  States that she was attempting to place a glass table into her vehicle yesterday.  Had to rearrange her multiple times to get it to fit.  Also moved into set up in her place of residence.  States that later on in the day, developed left low back pain.  Reports cramping type sensation in that area.  Took Tylenol  without improvement of symptoms.  Denies any known falls/traumas.  Denies any weakness/sensory deficits in lower extremities, fever, saddle anesthesia, bowel/bladder dysfunction, history of IV drug use, known malignancy, prolonged corticosteroid use.  Presents emergency department due to worsening pain.  Patient states that she just recently had a negative pregnancy test and is not concerned about pregnancy  Past medical history significant for hypertension  Prior to Admission medications   Medication Sig Start Date End Date Taking? Authorizing Provider  benzonatate  (TESSALON ) 200 MG capsule Take 1 capsule (200 mg total) by mouth 3 (three) times daily as needed for cough. 10/04/21   Kehrli, Kelsey F, PA-C  hydrocortisone  cream 1 % Apply to affected area 2 times daily 02/26/21   Caccavale, Sophia, PA-C  medroxyPROGESTERone (PROVERA) 10 MG tablet Take by mouth. 03/18/18   [provider]  metroNIDAZOLE  (FLAGYL ) 500 MG tablet Take 1 tablet (500 mg total) by mouth 2 (two) times daily. 01/19/23   Logan Ubaldo NOVAK, PA-C  ondansetron  (ZOFRAN -ODT) 4 MG disintegrating tablet Take 1 tablet (4 mg total) by mouth every 8 (eight) hours as needed for nausea or vomiting. 12/08/23   Geiple, Joshua, PA-C  predniSONE  (DELTASONE ) 20 MG tablet Take 2 tablets (40 mg total) by mouth daily with breakfast.  For the next four days 02/28/18   Garrick Charleston, MD  traMADol  (ULTRAM ) 50 MG tablet Take 1 tablet (50 mg total) by mouth every 6 (six) hours as needed. 06/26/22   Geroldine Berg, MD    Allergies: Patient has no known allergies.    Review of Systems  Musculoskeletal:  Positive for back pain.    Updated Vital Signs BP (!) (P) 130/90 (BP Location: Right Arm)   Pulse (P) 95   Temp 97.8 F (36.6 C) (Oral)   Resp (P) 20   Ht 5' 11 (1.803 m)   Wt 114.8 kg   SpO2 (P) 99%   BMI 35.29 kg/m   Physical Exam Vitals and nursing note reviewed.  Constitutional:      General: She is not in acute distress.    Appearance: She is well-developed.  HENT:     Head: Normocephalic and atraumatic.  Eyes:     Conjunctiva/sclera: Conjunctivae normal.  Cardiovascular:     Rate and Rhythm: Normal rate and regular rhythm.     Heart sounds: No murmur heard. Pulmonary:     Effort: Pulmonary effort is normal. No respiratory distress.     Breath sounds: Normal breath sounds.  Abdominal:     Palpations: Abdomen is soft.     Tenderness: There is no abdominal tenderness.  Musculoskeletal:        General: No swelling.     Cervical back: Neck supple.     Comments: No midline tenderness cervical, thoracic and lumbar spine without step-off or deformity.  Patient with left paraspinal tenderness to palpation lumbar region with radiation into left buttock.  Muscular strength 5 out of 5 lower extremities.  No sensory deficits on major nerve distributions of lower extremities.  DTR symmetric at patella/Achilles.  Pedal posterior tibial pulses 2+ bilaterally.  Skin:    General: Skin is warm and dry.     Capillary Refill: Capillary refill takes less than 2 seconds.  Neurological:     Mental Status: She is alert.  Psychiatric:        Mood and Affect: Mood normal.     (all labs ordered are listed, but only abnormal results are displayed) Labs Reviewed - No data to display  EKG: None  Radiology: No results  found.   Procedures   Medications Ordered in the ED  lidocaine  (LIDODERM ) 5 % 2 patch (2 patches Transdermal Patch Applied 04/24/24 1531)  ketorolac  (TORADOL ) 30 MG/ML injection 30 mg (30 mg Intramuscular Given 04/24/24 1528)  cyclobenzaprine  (FLEXERIL ) tablet 10 mg (10 mg Oral Given 04/24/24 1529)                                    Medical Decision Making Risk Prescription drug management.   This patient presents to the ED for concern of back pain, this involves an extensive number of treatment options, and is a complaint that carries with it a high risk of complications and morbidity.  The differential diagnosis includes fracture, strain/sprain, dislocation, ligamentous/tendinous injury, cauda equina, spinal epidural abscess, other spinal cord compression/impingement, pyelonephritis, nephrolithiasis, malignancy, neuro dissection, other   Co morbidities that complicate the patient evaluation  See HPI   Additional history obtained:  Additional history obtained from EMR External records from outside source obtained and reviewed including hospital records   Lab Tests:  N/a   Imaging Studies ordered:  N/a   Cardiac Monitoring: / EKG:  N/a   Consultations Obtained:  N/a   Problem List / ED Course / Critical interventions / Medication management  Low back pain I ordered medication including Toradol , Flexeril , Lidoderm    Reevaluation of the patient after these medicines showed that the patient improved I have reviewed the patients home medicines and have made adjustments as needed   Social Determinants of Health:  Former cigarette use.  Denies illicit drug use.   Test / Admission - Considered:  Low back pain Vitals signs within normal range and stable throughout visit. 33 year old female presents emergency department complaints of left back pain.  States that she was attempting to place a glass table into her vehicle yesterday.  Had to rearrange her  multiple times to get it to fit.  Also moved into set up in her place of residence.  States that later on in the day, developed left low back pain.  Reports cramping type sensation in that area.  Took Tylenol  without improvement of symptoms.  Denies any known falls/traumas.  Denies any weakness/sensory deficits in lower extremities, fever, saddle anesthesia, bowel/bladder dysfunction, history of IV drug use, known malignancy, prolonged corticosteroid use.  Presents emergency department due to worsening pain.  Patient states that she just recently had a negative pregnancy test and is not concerned about pregnancy On exam, tender palpation left paraspinal musculature lumbar spine as above.  No red flag signs or back pain HPI/PE; low suspicion for cauda equina, spinal epidural abscess, other spinal cord compression/impingement.  Patient without midline tenderness or appreciable traumatic injury on history;  low suspicion for fracture or dislocation.  Patient without urinary symptoms, flank pain/CVA tenderness; low suspicion for pyelonephritis, nephrolithiasis.  Patient without abdominal pain rating to back, pulse deficits, neurodeficits, hypertension; low suspicion for aortic dissection.  Suspect muscular etiology of patient's symptoms.  Recommend symptomatic therapy as an AVS and follow-up with PCP in the outpatient setting for reassessment.  Treatment plan discussed with patient and she had understanding was agreeable to said plan.  Patient overall well-appearing, afebrile and in no acute distress. Worrisome signs and symptoms were discussed with the patient, and the patient acknowledged understanding to return to the ED if noticed. Patient was stable upon discharge.       Final diagnoses:  None    ED Discharge Orders     None          Silver Wonda LABOR, [place] 04/24/24 1634    Elnor Jayson LABOR, DO 04/30/24 [phone]

## 2024-04-26 ENCOUNTER — Emergency Department (HOSPITAL_BASED_OUTPATIENT_CLINIC_OR_DEPARTMENT_OTHER)
Admission: EM | Admit: 2024-04-26 | Discharge: 2024-04-26 | Disposition: A | Discharge status: Home / Self Care | Attending: Emergency Medicine | Admitting: Emergency Medicine

## 2024-04-26 ENCOUNTER — Encounter (HOSPITAL_BASED_OUTPATIENT_CLINIC_OR_DEPARTMENT_OTHER): Payer: Self-pay | Admitting: Emergency Medicine

## 2024-04-26 ENCOUNTER — Emergency Department (HOSPITAL_BASED_OUTPATIENT_CLINIC_OR_DEPARTMENT_OTHER)

## 2024-04-26 DIAGNOSIS — I10 Essential (primary) hypertension: Secondary | ICD-10-CM | POA: Insufficient documentation

## 2024-04-26 DIAGNOSIS — M545 Low back pain, unspecified: Secondary | ICD-10-CM | POA: Insufficient documentation

## 2024-04-26 DIAGNOSIS — Z87891 Personal history of nicotine dependence: Secondary | ICD-10-CM | POA: Insufficient documentation

## 2024-04-26 MED ORDER — KETOROLAC TROMETHAMINE 30 MG/ML IJ SOLN
30.0000 mg | Freq: Once | INTRAMUSCULAR | Status: AC
  Administered 2024-04-26: 30 mg via INTRAMUSCULAR
  Filled 2024-04-26: qty 1

## 2024-04-26 MED ORDER — OXYCODONE HCL 5 MG PO TABS
5.0000 mg | ORAL_TABLET | Freq: Four times a day (QID) | ORAL | 0 refills | Status: AC | PRN
Start: 2024-04-26 — End: ?

## 2024-04-26 MED ORDER — OXYCODONE-ACETAMINOPHEN 5-325 MG PO TABS
1.0000 | ORAL_TABLET | Freq: Once | ORAL | Status: AC
  Administered 2024-04-26: 1 via ORAL
  Filled 2024-04-26: qty 1

## 2024-04-26 NOTE — ED Provider Notes (Signed)
 [facility] AT [facility] Provider Note   CSN: [id number] Arrival date & time: 04/26/24  1427     Patient presents with: Back Pain   Jessica Murray is a 33 y.o. female.    Back Pain   33 year old female presents again to the emergency department with complaints of continued back pain.  Was seen on 04/24/2024 for the same complaint.  Symptoms occurred when patient was attempting to place a glass table into her vehicle.  States that she was rearranging it multiple times to get a fit and then subsequently lifted it to set it up in her place of residence.  Was sent home with celebrex , valium  which she states has not been helping very much.  Patient states that she still having a lot of pain in the middle of her back that goes into her left buttock.  Denies any saddle anesthesia, bowel/bladder dysfunction, weakness has history deficits in lower extremities, fever, history of IV drug use, prolonged corticosteroid use.  Does state that movement makes it worse.  Presents with mother for reassessment.  Past medical history significant for hypertension  Prior to Admission medications   Medication Sig Start Date End Date Taking? Authorizing Provider  benzonatate  (TESSALON ) 200 MG capsule Take 1 capsule (200 mg total) by mouth 3 (three) times daily as needed for cough. 10/04/21   Kehrli, Kelsey F, PA-C  celecoxib  (CELEBREX ) 200 MG capsule Take 1 capsule (200 mg total) by mouth 2 (two) times daily as needed. 04/24/24   Silver Wonda LABOR, PA  diazepam  (VALIUM ) 5 MG tablet Take 1 tablet (5 mg total) by mouth every 12 (twelve) hours as needed for muscle spasms. 04/24/24   Silver Wonda LABOR, PA  hydrocortisone  cream 1 % Apply to affected area 2 times daily 02/26/21   Caccavale, Sophia, PA-C  lidocaine  (LIDODERM ) 5 % Place 1 patch onto the skin daily. Remove & Discard patch within 12 hours or as directed by MD 04/24/24   Silver Wonda LABOR, PA  medroxyPROGESTERone (PROVERA) 10 MG  tablet Take by mouth. 03/18/18   [provider]  metroNIDAZOLE  (FLAGYL ) 500 MG tablet Take 1 tablet (500 mg total) by mouth 2 (two) times daily. 01/19/23   Logan Ubaldo NOVAK, PA-C  ondansetron  (ZOFRAN -ODT) 4 MG disintegrating tablet Take 1 tablet (4 mg total) by mouth every 8 (eight) hours as needed for nausea or vomiting. 12/08/23   Geiple, Joshua, PA-C  predniSONE  (DELTASONE ) 20 MG tablet Take 2 tablets (40 mg total) by mouth daily with breakfast. For the next four days 02/28/18   Garrick Charleston, MD  traMADol  (ULTRAM ) 50 MG tablet Take 1 tablet (50 mg total) by mouth every 6 (six) hours as needed. 06/26/22   Geroldine Berg, MD    Allergies: Patient has no known allergies.    Review of Systems  Musculoskeletal:  Positive for back pain.  All other systems reviewed and are negative.   Updated Vital Signs BP (!) 138/102 (BP Location: Right Arm)   Pulse (!) 107   Temp 97.9 F (36.6 C) (Oral)   Resp 14   Ht 5' 11 (1.803 m)   Wt 114.8 kg   SpO2 99%   BMI 35.29 kg/m   Physical Exam Vitals and nursing note reviewed.  Constitutional:      General: She is not in acute distress.    Appearance: She is well-developed.  HENT:     Head: Normocephalic and atraumatic.  Eyes:     Conjunctiva/sclera: Conjunctivae normal.  Cardiovascular:     Rate and Rhythm: Normal rate and regular rhythm.     Heart sounds: No murmur heard. Pulmonary:     Effort: Pulmonary effort is normal. No respiratory distress.     Breath sounds: Normal breath sounds.  Abdominal:     Palpations: Abdomen is soft.     Tenderness: There is no abdominal tenderness.  Musculoskeletal:        General: No swelling.     Cervical back: Neck supple.     Comments: Muscular strength 5 out of 5 lower extremities.  No sensory deficits along major nerve distributions of lower extremities.  DTR symmetric at patella as well as Achilles.  Pedal and posterior tibial pulses 2+ bilaterally.  Patient with midline tenderness lumbar  spine with left paraspinal tenderness noted in lumbar region with extension down the left buttock.  Skin:    General: Skin is warm and dry.     Capillary Refill: Capillary refill takes less than 2 seconds.  Neurological:     Mental Status: She is alert.  Psychiatric:        Mood and Affect: Mood normal.     (all labs ordered are listed, but only abnormal results are displayed) Labs Reviewed - No data to display  EKG: None  Radiology: No results found.   Procedures   Medications Ordered in the ED - No data to display  Clinical Course as of 04/26/24 CLAIR Repress Apr 26, 2024  1830 DG Lumbar Spine Complete [CR]    Clinical Course User Index [CR] Silver Wonda LABOR, [place]                                 Medical Decision Making Amount and/or Complexity of Data Reviewed Radiology: ordered. Decision-making details documented in ED Course.  Risk Prescription drug management.    This patient presents to the ED for concern of back pain, this involves an extensive number of treatment options, and is a complaint that carries with it a high risk of complications and morbidity.  The differential diagnosis includes fracture, strain/sprain, dislocation, ligamentous/tendinous injury, cauda equina, spinal epidural abscess, other spinal cord compression/impingement, pyelonephritis, nephrolithiasis, malignancy, aortic dissection, other     Co morbidities that complicate the patient evaluation   See HPI     Additional history obtained:   Additional history obtained from EMR External records from outside source obtained and reviewed including hospital records     Lab Tests:   Patient declined urine pregnancy testing.  Imaging Studies ordered:   I ordered lumbar x-ray and agreed with radiology interpretation for independent assessment which showed: Slight L4-L5 to space narrowing     Cardiac Monitoring: / EKG:   N/a     Consultations Obtained:   N/a     Problem List / ED  Course / Critical interventions / Medication management   Low back pain I ordered medication including Toradol , percocet Reevaluation of the patient after these medicines showed that the patient improved I have reviewed the patients home medicines and have made adjustments as needed     Social Determinants of Health:   Former cigarette use.  Denies illicit drug use.     Test / Admission - Considered:   Low back pain Vitals signs within normal range and stable throughout visit. 33 year old female presents again to the emergency department with complaints of continued back pain.  Was seen on 04/24/2024 for the  same complaint.  Symptoms occurred when patient was attempting to place a glass table into her vehicle.  States that she was rearranging it multiple times to get a fit and then subsequently lifted it to set it up in her place of residence.  Was sent home with celebrex , valium  which she states has not been helping very much.  Patient states that she still having a lot of pain in the middle of her back that goes into her left buttock.  Denies any saddle anesthesia, bowel/bladder dysfunction, weakness has history deficits in lower extremities, fever, history of IV drug use, prolonged corticosteroid use.  Does state that movement makes it worse.  Presents with mother for reassessment. On exam, tender palpation left paraspinal musculature lumbar spine as well as midline tenderness in lumbar spine as above.  No red flag signs or back pain HPI/PE; low suspicion for cauda equina, spinal epidural abscess, other spinal cord compression/impingement.  Given midline tenderness as well as repeat visit, lumbar x-ray was performed which did show L4-L5 compression but without obvious fracture or dislocation.  Patient without urinary symptoms, flank pain/CVA tenderness; low suspicion for pyelonephritis, nephrolithiasis.  Patient without abdominal pain rating to back, pulse deficits, neurodeficits, hypertension;  low suspicion for aortic dissection.  Suspect musculoskeletal etiology of patient's symptoms.  Recommend symptomatic therapy as an AVS and follow-up with PCP in the outpatient setting for reassessment.  Treatment plan discussed with patient and she had understanding was agreeable to said plan.  Patient overall well-appearing, afebrile and in no acute distress. Worrisome signs and symptoms were discussed with the patient, and the patient acknowledged understanding to return to the ED if noticed. Patient was stable upon discharge.      Final diagnoses:  None    ED Discharge Orders     None          Silver Wonda LABOR, [place] 04/26/24 1918    Pamella Ozell LABOR, DO 04/28/24 1548

## 2024-04-26 NOTE — ED Triage Notes (Signed)
 Pt with continued/worsening back pain since 7/18; medications are not helping at all

## 2024-04-26 NOTE — Discharge Instructions (Signed)
 As discussed, x-ray of your lumbar spine did show compression of the disc in between your L4 and L5 vertebrae.  Continue take anti-inflammatory in the form of Celebrex  at home.  Recommend getting a back brace over-the-counter at your pharmacy or online at [facility] to help with supporting your back.  Will send you home with additional medicine for pain.  Please do not take this along with the Valium /Diazepam  because they can alter your respiratory drive.  Recommend follow-up with a spinal specialist/primary care in the outpatient setting for reassessment.  Please do not hesitate to return to emergency department if the worrisome signs and symptoms we discussed become apparent.
